# Patient Record
Sex: Female | Born: 1970 | Race: White | Hispanic: No | Smoking: Current every day smoker
Health system: Southern US, Community
[De-identification: ages and names within clinical notes are randomized; demographics above are authoritative.]

## PROBLEM LIST (undated history)

## (undated) DIAGNOSIS — M545 Low back pain, unspecified: Secondary | ICD-10-CM

## (undated) DIAGNOSIS — G540 Brachial plexus disorders: Secondary | ICD-10-CM

## (undated) DIAGNOSIS — Z8719 Personal history of other diseases of the digestive system: Secondary | ICD-10-CM

## (undated) DIAGNOSIS — G43909 Migraine, unspecified, not intractable, without status migrainosus: Secondary | ICD-10-CM

## (undated) DIAGNOSIS — F419 Anxiety disorder, unspecified: Secondary | ICD-10-CM

## (undated) DIAGNOSIS — J45909 Unspecified asthma, uncomplicated: Secondary | ICD-10-CM

## (undated) DIAGNOSIS — M940 Chondrocostal junction syndrome [Tietze]: Secondary | ICD-10-CM

## (undated) HISTORY — DX: Low back pain, unspecified: M54.50

## (undated) HISTORY — DX: Chondrocostal junction syndrome (tietze): M94.0

## (undated) HISTORY — DX: Personal history of other diseases of the digestive system: Z87.19

## (undated) HISTORY — DX: Brachial plexus disorders: G54.0

## (undated) HISTORY — PX: DILATION AND CURETTAGE OF UTERUS: SHX78

## (undated) HISTORY — PX: ENDOMETRIAL ABLATION: SHX621

## (undated) HISTORY — DX: Low back pain: M54.5

## (undated) HISTORY — DX: Anxiety disorder, unspecified: F41.9

## (undated) HISTORY — PX: OTHER SURGICAL HISTORY: SHX169

## (undated) HISTORY — DX: Migraine, unspecified, not intractable, without status migrainosus: G43.909

---

## 1998-05-25 ENCOUNTER — Other Ambulatory Visit: Admission: RE | Admit: 1998-05-25 | Discharge: 1998-05-25 | Payer: Self-pay | Admitting: *Deleted

## 1998-08-22 ENCOUNTER — Ambulatory Visit (HOSPITAL_COMMUNITY): Admission: RE | Admit: 1998-08-22 | Discharge: 1998-08-22 | Payer: Self-pay | Admitting: *Deleted

## 1999-03-11 ENCOUNTER — Other Ambulatory Visit: Admission: RE | Admit: 1999-03-11 | Discharge: 1999-03-11 | Payer: Self-pay | Admitting: Obstetrics & Gynecology

## 1999-09-17 ENCOUNTER — Inpatient Hospital Stay (HOSPITAL_COMMUNITY): Admission: AD | Admit: 1999-09-17 | Discharge: 1999-09-20 | Payer: Self-pay | Admitting: Obstetrics and Gynecology

## 1999-09-21 ENCOUNTER — Encounter: Admission: RE | Admit: 1999-09-21 | Discharge: 1999-10-15 | Payer: Self-pay | Admitting: Obstetrics and Gynecology

## 1999-09-22 ENCOUNTER — Inpatient Hospital Stay (HOSPITAL_COMMUNITY): Admission: AD | Admit: 1999-09-22 | Discharge: 1999-09-22 | Payer: Self-pay | Admitting: Obstetrics and Gynecology

## 2000-04-27 ENCOUNTER — Other Ambulatory Visit: Admission: RE | Admit: 2000-04-27 | Discharge: 2000-04-27 | Payer: Self-pay | Admitting: *Deleted

## 2001-07-31 ENCOUNTER — Other Ambulatory Visit: Admission: RE | Admit: 2001-07-31 | Discharge: 2001-07-31 | Payer: Self-pay | Admitting: *Deleted

## 2002-12-23 ENCOUNTER — Encounter: Payer: Self-pay | Admitting: Family Medicine

## 2002-12-23 ENCOUNTER — Encounter: Admission: RE | Admit: 2002-12-23 | Discharge: 2002-12-23 | Payer: Self-pay | Admitting: Family Medicine

## 2004-01-20 ENCOUNTER — Other Ambulatory Visit: Admission: RE | Admit: 2004-01-20 | Discharge: 2004-01-20 | Payer: Self-pay | Admitting: Obstetrics and Gynecology

## 2004-02-16 ENCOUNTER — Ambulatory Visit (HOSPITAL_COMMUNITY): Admission: RE | Admit: 2004-02-16 | Discharge: 2004-02-16 | Payer: Self-pay | Admitting: Obstetrics and Gynecology

## 2004-03-02 ENCOUNTER — Ambulatory Visit (HOSPITAL_COMMUNITY): Admission: RE | Admit: 2004-03-02 | Discharge: 2004-03-02 | Payer: Self-pay | Admitting: Obstetrics and Gynecology

## 2004-06-24 ENCOUNTER — Inpatient Hospital Stay (HOSPITAL_COMMUNITY): Admission: AD | Admit: 2004-06-24 | Discharge: 2004-06-24 | Payer: Self-pay | Admitting: Obstetrics and Gynecology

## 2004-07-27 ENCOUNTER — Inpatient Hospital Stay (HOSPITAL_COMMUNITY): Admission: AD | Admit: 2004-07-27 | Discharge: 2004-07-27 | Payer: Self-pay | Admitting: Obstetrics and Gynecology

## 2004-08-01 ENCOUNTER — Encounter (INDEPENDENT_AMBULATORY_CARE_PROVIDER_SITE_OTHER): Payer: Self-pay | Admitting: *Deleted

## 2004-08-01 ENCOUNTER — Inpatient Hospital Stay (HOSPITAL_COMMUNITY): Admission: AD | Admit: 2004-08-01 | Discharge: 2004-08-03 | Payer: Self-pay | Admitting: Obstetrics and Gynecology

## 2004-10-22 ENCOUNTER — Ambulatory Visit: Payer: Self-pay | Admitting: Internal Medicine

## 2004-12-16 ENCOUNTER — Ambulatory Visit: Payer: Self-pay | Admitting: Internal Medicine

## 2005-03-29 ENCOUNTER — Encounter: Admission: RE | Admit: 2005-03-29 | Discharge: 2005-03-29 | Payer: Self-pay | Admitting: Family Medicine

## 2005-03-30 ENCOUNTER — Encounter: Admission: RE | Admit: 2005-03-30 | Discharge: 2005-03-30 | Payer: Self-pay | Admitting: Family Medicine

## 2005-04-25 IMAGING — US US OB DETAIL+14 WK
1 series · 13 of 28 positions shown · non-contrast
Comparison: none

CLINICAL DATA: 18 week 2 day gestational age by LMP.  Advanced maternal age.  Evaluate dating and anatomy.

[Series 1: unknown · 0.22mm/px · 13 of 69 slices shown]
[im 3/69]
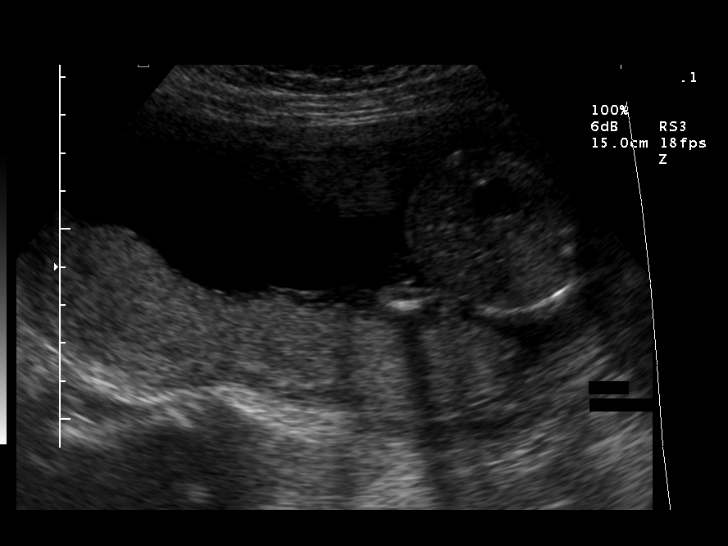
[im 8/69]
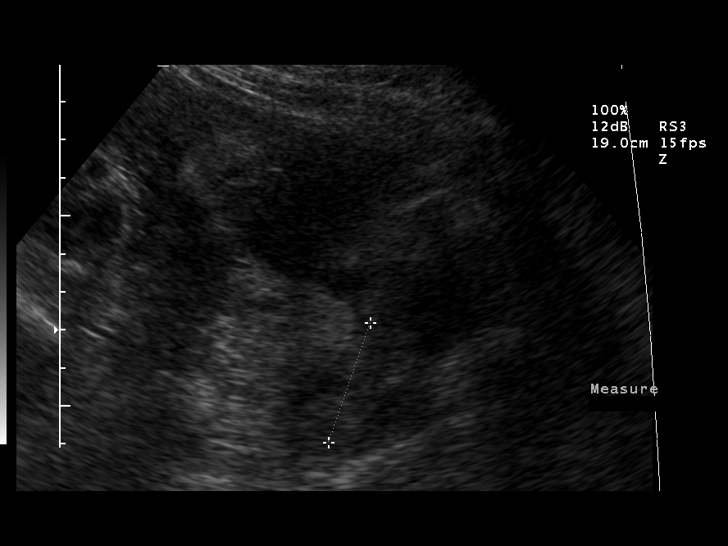
[im 13/69]
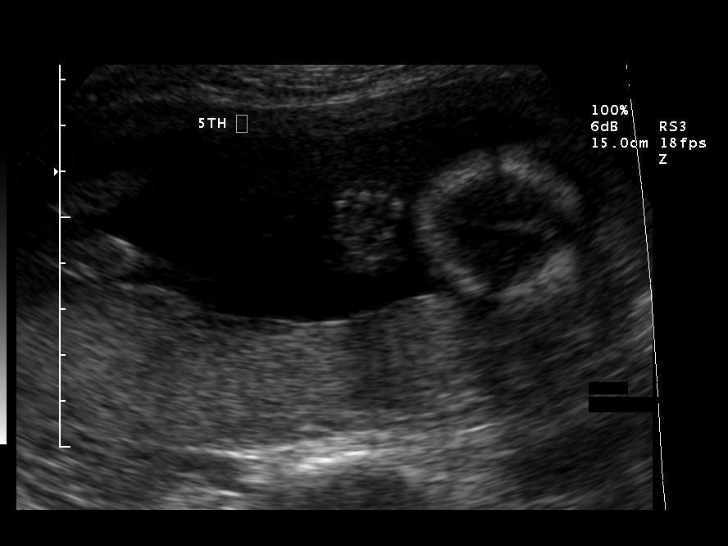
[im 18/69]
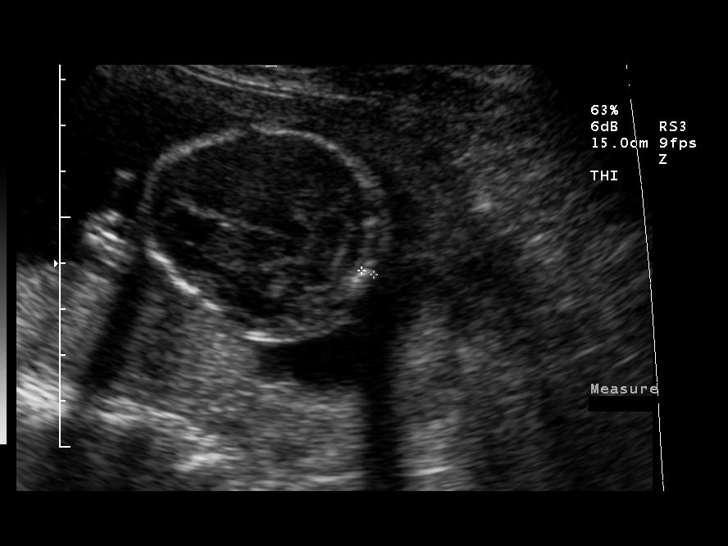
[im 23/69]
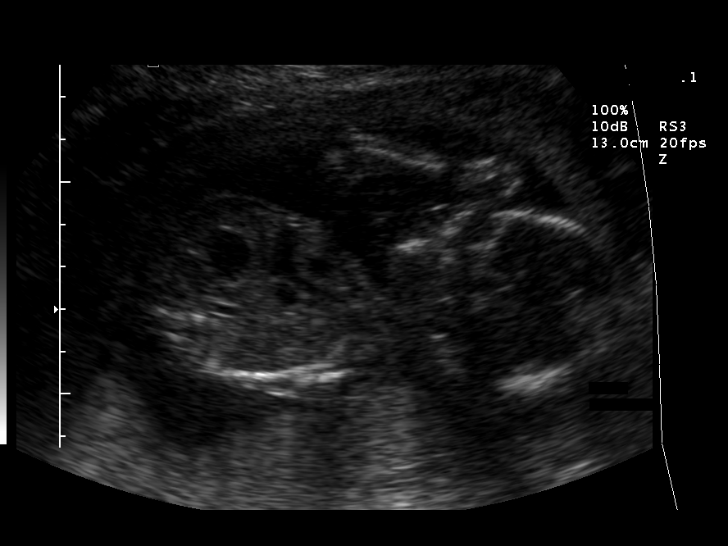
[im 28/69]
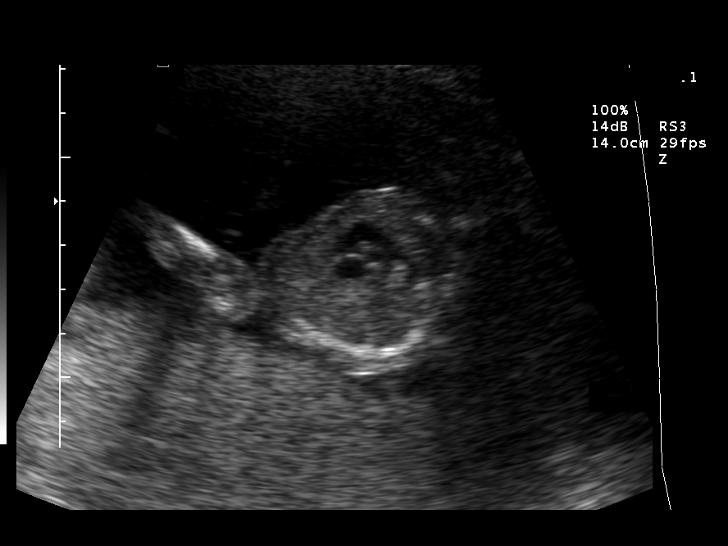
[im 36/69]
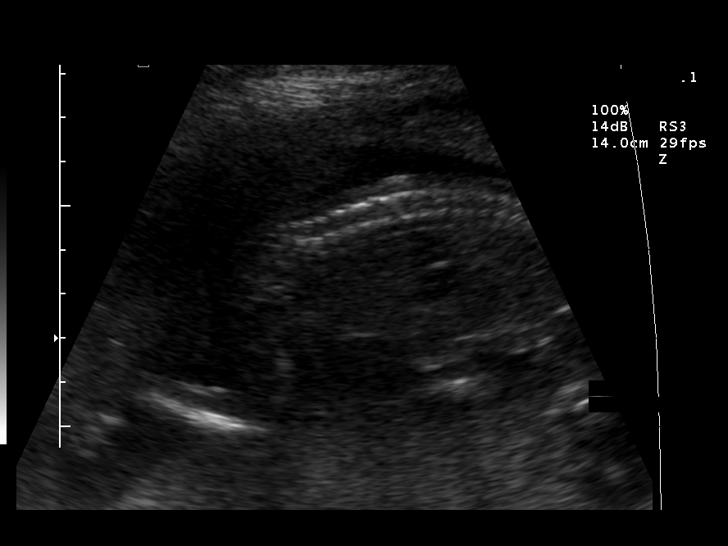
[im 41/69]
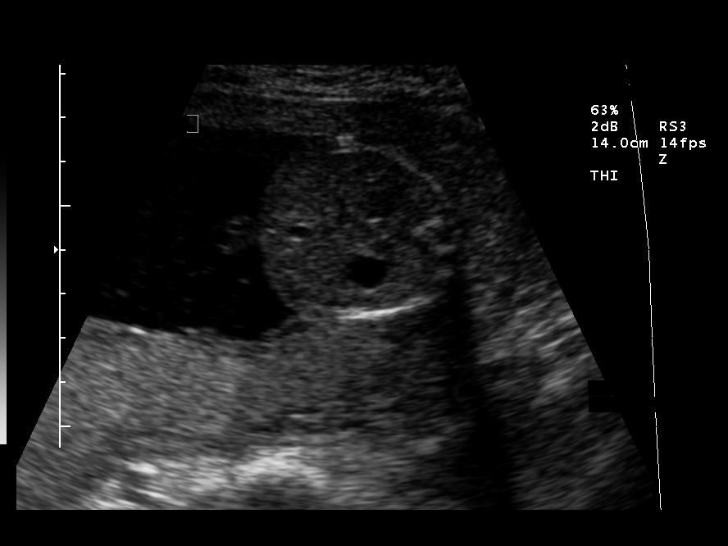
[im 46/69]
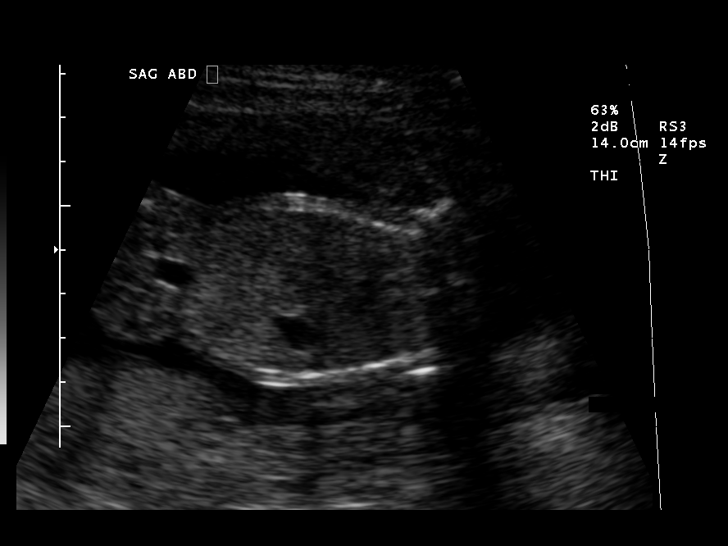
[im 51/69]
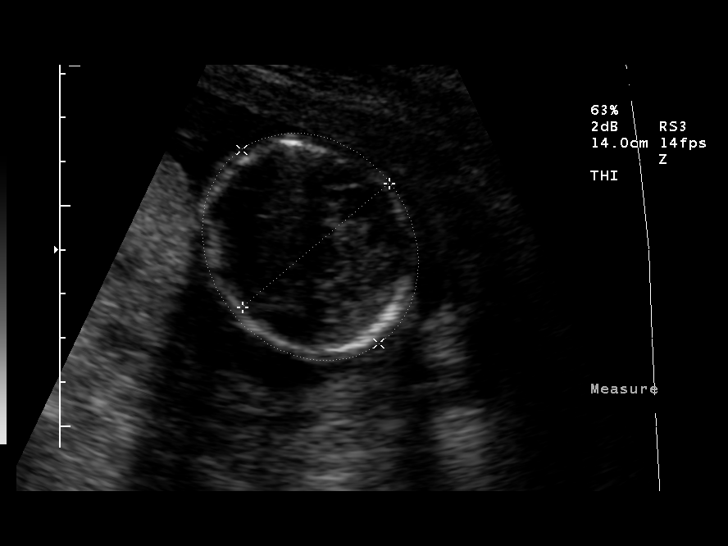
[im 56/69]
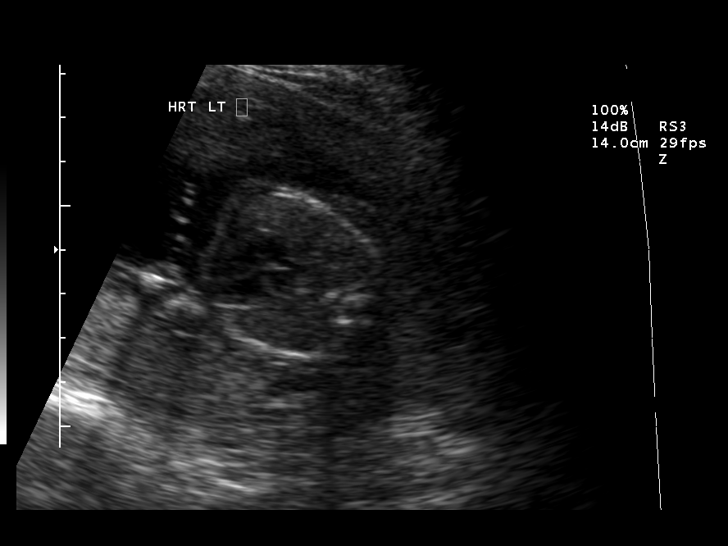
[im 61/69]
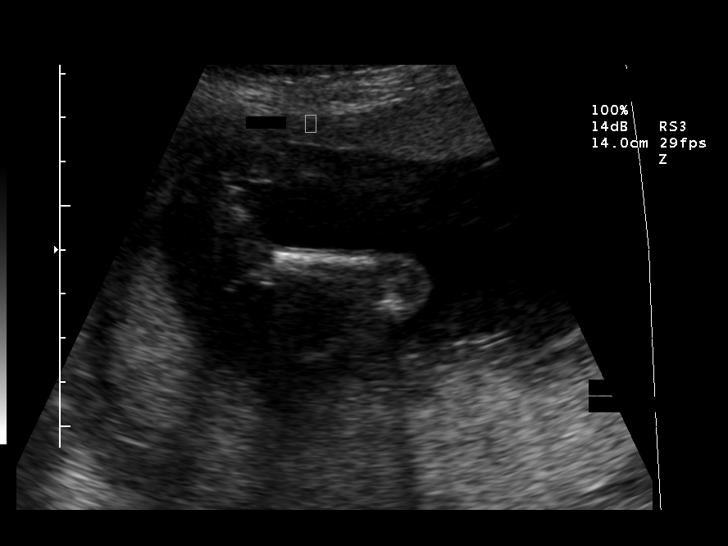
[im 66/69]
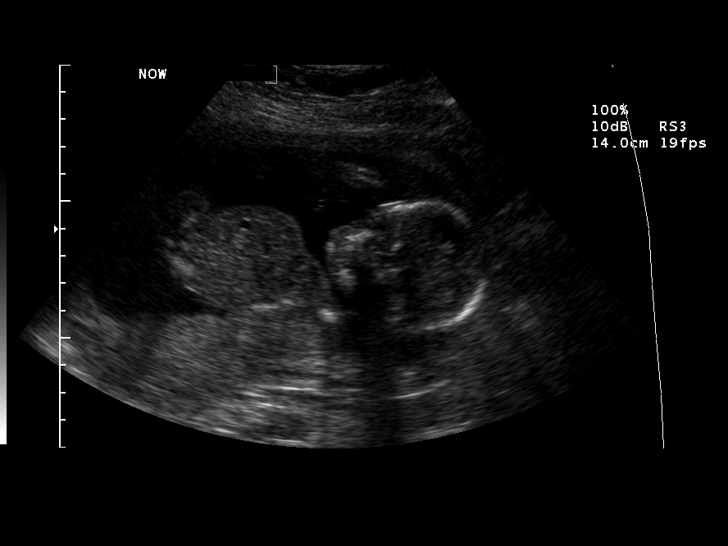

[13 of 28 positions shown; findings below may reference images not displayed]

DETAILED OBSTETRICAL ULTRASOUND 

Number of Fetuses:  1
Heart Rate:  155
Movement:  Yes
Breathing:  No
Presentation:  Variable
Placental Location:  Posterior
Grade:  I
Previa:  No
Amniotic Fluid (Subjective):  Normal
Amniotic Fluid (Objective):  4.9 cm Vertical pocket 

FETAL BIOMETRY
BPD:  4.3 cm   19 w 0 d
HC:  15.5 cm   18 w 3 d
AC:  13.8 cm   19 w 2 d
FL:  3.0 cm   19 w 2 d
HL:  2.9 cm  19 w 3 d

MEAN GA:  19 w 1 d

FETAL ANATOMY
Lateral Ventricles:  Visualized 
Thalami/CSP:  Visualized 
Posterior Fossa:  Visualized 
Nuchal Region:  Visualized 
Spine:  Visualized 
4 Chamber Heart on Left:  Visualized 
Stomach on Left:  Visualized 
3 Vessel Cord:  Visualized 
Cord Insertion Site:  Visualized 
Kidneys:  Visualized 
Bladder:  Visualized 
Extremities:  Visualized 

ADDITIONAL ANATOMY VISUALIZED:  RVOT, LVOT, upper lip, orbits, profile, diaphragm, heel, 5th digit, ductal arch, and aortic arch.
Comment:  A nasal bone is visualized.  No sonographic markers for Down syndrome are identified.  
MATERNAL FINDINGS
Cervix:  3.2 cm Transabdominally
IMPRESSION: Single living intrauterine fetus with mean gestational age of 19 weeks 1 day and sonographic EDC of 07/26/04.  This is within one week of stated LMP.  
  No evidence of fetal anatomic abnormality.  No sonographic markers for Down syndrome identified.

</u12:p>

## 2005-08-17 IMAGING — CR DG CHEST 2V
2 series · 2 of 2 positions shown · non-contrast
Comparison: 12/23/02.

CLINICAL DATA: 35 weeks gestation with bronchitis and upper respiratory infection.
 TWO VIEW CHEST ? 06/24/04:

[view not recorded (1 of 2)]
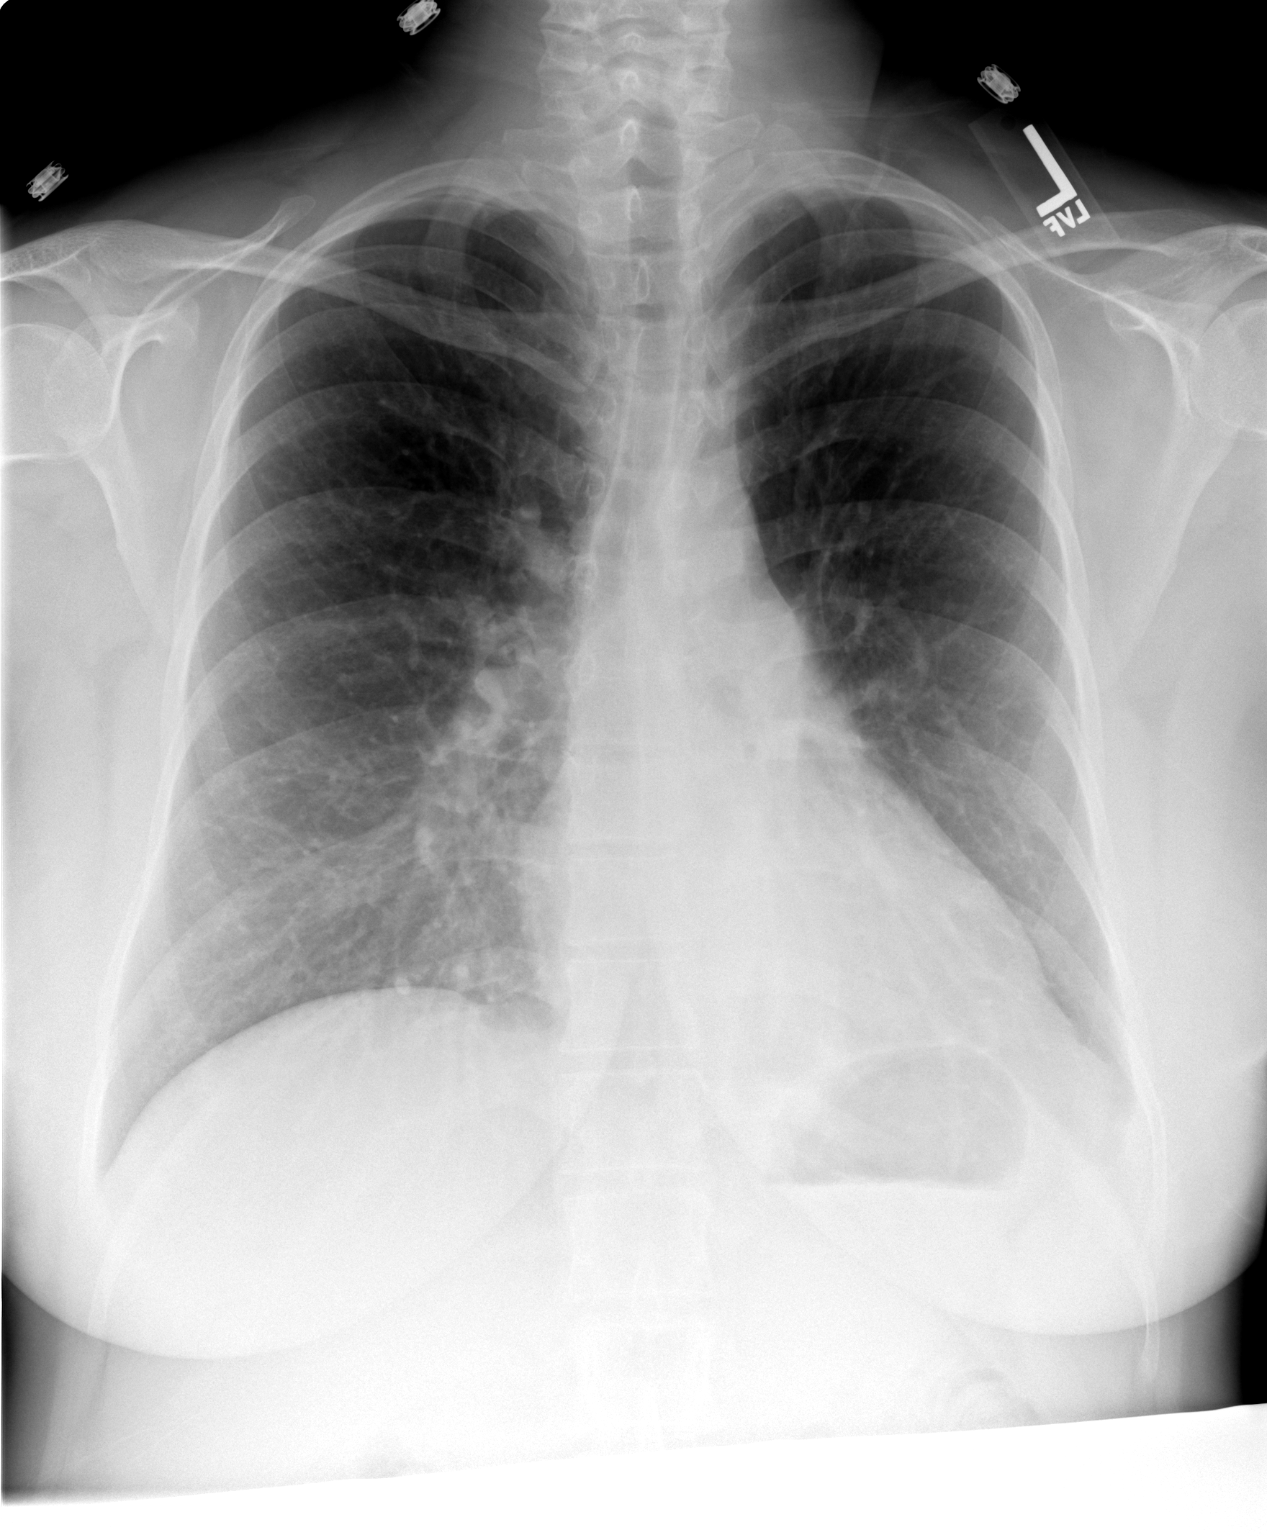

[view not recorded (2 of 2)]
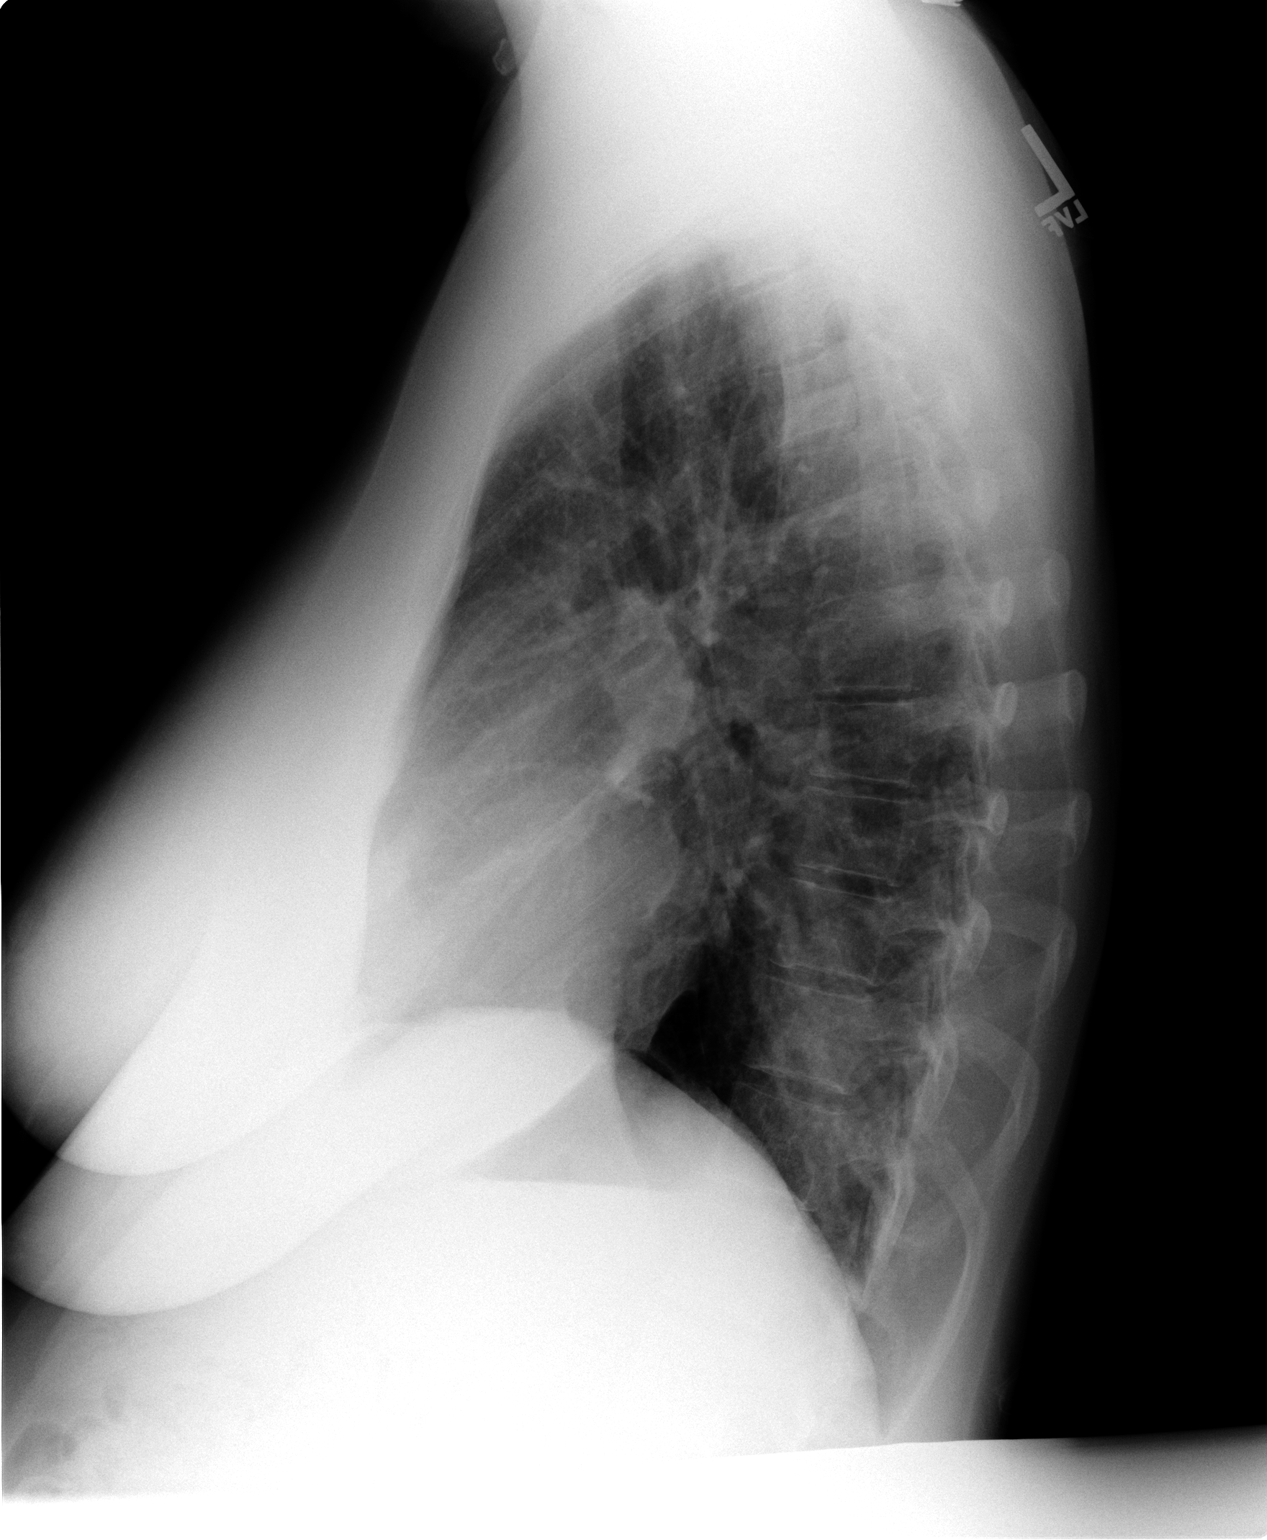

[2 of 2 positions shown; findings below may reference images not displayed]

FINDINGS: Two view examination of the chest shows streaky opacity in the posterior left lower lobe which is a stable appearance compared to the earlier study.  The right lung is clear.  Mild peribronchial thickening is apparent.  The heart size is at upper limits of normal.  The bony structures are intact.
IMPRESSION: 1.  Left lower lobe scarring, unchanged.
 2.  Mild peribronchial thickening.

## 2005-09-07 ENCOUNTER — Other Ambulatory Visit: Admission: RE | Admit: 2005-09-07 | Discharge: 2005-09-07 | Payer: Self-pay | Admitting: Obstetrics and Gynecology

## 2006-02-08 ENCOUNTER — Encounter (INDEPENDENT_AMBULATORY_CARE_PROVIDER_SITE_OTHER): Payer: Self-pay | Admitting: *Deleted

## 2006-02-08 ENCOUNTER — Ambulatory Visit (HOSPITAL_COMMUNITY): Admission: RE | Admit: 2006-02-08 | Discharge: 2006-02-08 | Payer: Self-pay | Admitting: Obstetrics and Gynecology

## 2006-04-06 ENCOUNTER — Ambulatory Visit: Payer: Self-pay | Admitting: Family Medicine

## 2006-04-12 ENCOUNTER — Encounter: Admission: RE | Admit: 2006-04-12 | Discharge: 2006-04-12 | Payer: Self-pay | Admitting: Family Medicine

## 2006-04-12 ENCOUNTER — Ambulatory Visit: Payer: Self-pay | Admitting: Family Medicine

## 2006-06-09 ENCOUNTER — Ambulatory Visit: Payer: Self-pay | Admitting: Internal Medicine

## 2006-10-13 ENCOUNTER — Ambulatory Visit: Payer: Self-pay | Admitting: Family Medicine

## 2006-11-08 LAB — CONVERTED CEMR LAB: Pap Smear: NORMAL

## 2007-03-05 ENCOUNTER — Ambulatory Visit: Payer: Self-pay | Admitting: Family Medicine

## 2007-05-10 ENCOUNTER — Telehealth: Payer: Self-pay | Admitting: Internal Medicine

## 2007-05-14 ENCOUNTER — Telehealth: Payer: Self-pay | Admitting: Internal Medicine

## 2007-06-01 ENCOUNTER — Encounter: Payer: Self-pay | Admitting: *Deleted

## 2007-06-01 DIAGNOSIS — M545 Low back pain, unspecified: Secondary | ICD-10-CM | POA: Insufficient documentation

## 2007-06-01 DIAGNOSIS — G43909 Migraine, unspecified, not intractable, without status migrainosus: Secondary | ICD-10-CM | POA: Insufficient documentation

## 2007-06-01 DIAGNOSIS — Z8719 Personal history of other diseases of the digestive system: Secondary | ICD-10-CM

## 2007-06-01 DIAGNOSIS — F411 Generalized anxiety disorder: Secondary | ICD-10-CM | POA: Insufficient documentation

## 2007-06-01 DIAGNOSIS — M940 Chondrocostal junction syndrome [Tietze]: Secondary | ICD-10-CM

## 2007-06-20 ENCOUNTER — Encounter: Payer: Self-pay | Admitting: Internal Medicine

## 2007-06-26 ENCOUNTER — Ambulatory Visit: Payer: Self-pay | Admitting: Internal Medicine

## 2007-08-14 ENCOUNTER — Telehealth: Payer: Self-pay | Admitting: Internal Medicine

## 2007-12-12 ENCOUNTER — Encounter: Admission: RE | Admit: 2007-12-12 | Discharge: 2007-12-12 | Payer: Self-pay | Admitting: Neurology

## 2008-01-17 ENCOUNTER — Telehealth: Payer: Self-pay | Admitting: Internal Medicine

## 2008-07-22 ENCOUNTER — Telehealth: Payer: Self-pay | Admitting: Internal Medicine

## 2008-09-30 ENCOUNTER — Telehealth: Payer: Self-pay | Admitting: Internal Medicine

## 2008-11-20 ENCOUNTER — Telehealth: Payer: Self-pay | Admitting: Internal Medicine

## 2009-01-06 ENCOUNTER — Ambulatory Visit: Payer: Self-pay | Admitting: Internal Medicine

## 2009-07-23 ENCOUNTER — Telehealth: Payer: Self-pay | Admitting: Internal Medicine

## 2009-12-16 ENCOUNTER — Encounter: Payer: Self-pay | Admitting: Internal Medicine

## 2009-12-17 ENCOUNTER — Telehealth: Payer: Self-pay | Admitting: Internal Medicine

## 2010-07-05 ENCOUNTER — Telehealth: Payer: Self-pay | Admitting: Internal Medicine

## 2010-07-27 NOTE — Progress Notes (Signed)
Summary: Alprazolam refill  Phone Note Refill Request Message from:  Fax from Pharmacy on July 23, 2009 11:49 AM  Refills Requested: Medication #1:  ALPRAZOLAM 0.5 MG  TABS 1 by mouth every six hours as needed   Dosage confirmed as above?Dosage Confirmed   Last Refilled: 06/23/2009 last prescription was 01-06-09  Initial call taken by: Lucious Groves,  July 23, 2009 11:49 AM  Follow-up for Phone Call        ok for refill x 5 Follow-up by: Jacques Navy MD,  July 23, 2009 1:20 PM    Prescriptions: ALPRAZOLAM 0.5 MG  TABS (ALPRAZOLAM) 1 by mouth every six hours as needed  #90 x 5   Entered by:   Ami Bullins CMA   Authorized by:   Jacques Navy MD   Signed by:   Bill Salinas CMA on 07/23/2009   Method used:   Telephoned to ...       [facility] [address] [phone]* (retail)       [address]       [place], [place]  [address]       Ph: [phone] or [phone]       Fax: [phone]   RxID:   [id number]

## 2010-07-27 NOTE — Progress Notes (Signed)
  Phone Note Refill Request Message from:  Fax from Pharmacy on December 17, 2009 8:48 AM  Refills Requested: Medication #1:  ALPRAZOLAM 0.5 MG  TABS 1 by mouth every six hours as needed   Last Refilled: 11/19/2009 recieved fax from [facility] [address]. Please Advise refill  Initial call taken by: Ami Bullins CMA,  December 17, 2009 8:48 AM  Follow-up for Phone Call        ok for refill x 5 Follow-up by: Jacques Navy MD,  December 17, 2009 5:27 PM    Prescriptions: ALPRAZOLAM 0.5 MG  TABS (ALPRAZOLAM) 1 by mouth every six hours as needed  #90 x 5   Entered by:   Ami Bullins CMA   Authorized by:   Jacques Navy MD   Signed by:   Bill Salinas CMA on 12/18/2009   Method used:   Telephoned to ...       [facility]  [address]  [phone]* (retail)       [address]       [place], [place]  [address]       Ph: [phone] or [phone]       Fax: [phone]   RxID:   [id number]

## 2010-07-27 NOTE — Medication Information (Signed)
Summary: Coordination of Care Form/[facility]  Coordination of Care Form/[facility]   Imported By: Sherian Rein 12/21/2009 11:16:25  _____________________________________________________________________  External Attachment:    Type:   Image     Comment:   External Document

## 2010-07-29 NOTE — Progress Notes (Signed)
Summary: rf xanax  Phone Note Refill Request Message from:  Fax from Pharmacy on July 05, 2010 12:00 PM  Refills Requested: Medication #1:  ALPRAZOLAM 0.5 MG  TABS 1 by mouth every six hours as needed   Last Ref[facility]: [facility] [address]  Initial call taken by: Ami Bullins CMA,  July 05, 2010 12:00 PM  Follow-up for Phone Call        ok to refill x 5 Follow-up by: Jacques Navy MD,  July 05, 2010 1:09 PM    Prescriptions: ALPRAZOLAM 0.5 MG  TABS (ALPRAZOLAM) 1 by mouth every six hours as needed  #90 x 5   Entered by:   Lamar Sprinkles, CMA   Authorized by:   Jacques Navy MD   Signed by:   Lamar Sprinkles, CMA on 07/05/2010   Method used:   Telephoned to ...       [facility]  [phone]* (retail)       [address] [place], [place]  [address]       Ph: [phone] or [phone]       Fax: [phone]   RxID:   [id number]

## 2010-11-12 NOTE — Op Note (Signed)
NAME:  Jeppsen, ENVI EAGLESON                          ACCOUNT NO.:  [id number]   MEDICAL RECORD NO.:  [id number]                   PATIENT TYPE:  OUT   LOCATION:  ULT                                  [facility]   PHYSICIAN:  Janine Limbo, M.D.            DATE OF BIRTH:  1971-02-23   DATE OF PROCEDURE:  02/16/2004  DATE OF DISCHARGE:                                 OPERATIVE REPORT   PREOPERATIVE DIAGNOSES:  1. Sixteen weeks' gestation.  2. Age greater than 35 at the time of delivery.   POSTOPERATIVE DIAGNOSES:  1. Sixteen weeks' gestation.  2. Age greater than 35 at the time of delivery.   PROCEDURE IN DETAIL:  Genetic amniocentesis.   SURGEON:  Janine Limbo, M.D.   ANESTHESIA:  Local with Xylocaine.   DISPOSITION:  Ms. Benninger is a 40 year old female, who will be 33 at the time  of her delivery.  She presents for genetic amniocentesis.  She understands  the indications for her procedure and she accepts the risks of, but not  limited to, infection, bleeding, rupture of membranes, and the possible risk  of miscarriage (one in 200).   FINDINGS:  The patient's blood type was O positive.  On ultrasound she had a  single intrauterine gestation in variable position.  The amniotic fluid  volume was normal.   PROCEDURE:  The patient was seen in the ultrasound suite.  The procedure was  reviewed with the patient and a permit was signed.  The patient's abdomen  was prepped with multiple layers of Betadine and the ultrasound was  repeated.  An appropriate fluid pocket was identified.  The patient was  sterilely draped.  The skin was anesthetized using 1 mL of 1% Xylocaine.  The spinal needle was inserted under direct visualization.  Fifteen to 20 mL  of fluid was obtained without difficulty.  The fluid was initially slightly  blood-tinged but then was very clear.  The patient tolerated her procedure  well.  Repeat ultrasound was performed, and the fetal heart motions were  noted to be normal.  There was no evidence of damage to the gestation.  Her  fluid was sent for chromosomal analysis.  The patient was discharged from  the radiology suite in stable condition.                                               Janine Limbo, M.D.    AVS/MEDQ  D:  02/16/2004  T:  02/16/2004  Job:  [id number]

## 2010-11-12 NOTE — Letter (Signed)
June 09, 2006    Rene Kocher, M.D.  [address]  [place], [place] [address]   RE:  JAICE, LAGUE  MRN:  [id number]  /  DOB:  1970-10-23   Dear Woody Seller,   Thanks very much for seeing Ms. Hartshorn for refractory migraine headache.   The patient has a longstanding history of headaches which occur 2 to 4  times a month.  She thought they were hormonal at one time but they do  not seem to be related to her menstrual cycle or hormones at this time.  She has tried multiple 5-HT products in the past including Zomig and  Relpax which do not seem to give her adequate relief.  She describes the  headache as a 30-minute aura, then a full-blown headache which will last  12-24 hours.  She has not had a history of photophobia and generally she  becomes bed bound with these headaches.  She does get relief with  Vicodin when needed.  Because of her ongoing headaches at this point, I  would appreciate your assistance in evaluating her and helping develop a  better preventative program for her as well as treatment regimen.   PAST MEDICAL HISTORY:  Surgical:  The patient had a hysteroscopy and D&C  in the summer of 2007.  She also had incision in January of a  chronically-infected follicle.  No other history of surgery.  Medical:  The patient had the usual childhood diseases.  She has a  history of low back pain.  She has a history of IBS and history of  headache as noted.  GYN history:  She is a gravida 4 para 2, 1 SAB, one TAB.   CURRENT MEDICATIONS:  1. Multivitamin daily.  2. Levsin b.i.d.  3. Omega 3 fatty acids.  4. She has been taking Relpax on an as-needed basis.  5. Xanax 0.5 q.6h p.r.n. basis.  6. She has been using Metamucil to help control her IBS.   FAMILY HISTORY:  Noncontributory, with no family history of significant  migraine.   SOCIAL HISTORY:  The patient is married.  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  Her marriage is in good  health.   My last laboratory on this patient dates from 2006 where she had a  normal CBC.  She had normal chemistries with a blood sugar of 109,  normal kidney function and liver function.  She has mild hyperlipidemia  with an LDL of 147.8, normal TSH at 1.66.  Of note, the patient did have  recent laboratory work by her gynecologist prior to her surgery as  noted.  I have no neuro imaging studies.   If I can provide any additional information in regards to this nice  patient, please do not hesitate to contact me.  I appreciate your  assistance in her care and look forward to hearing from you.    Sincerely,      Rosalyn Gess. Norins, MD  Electronically Signed    MEN/MedQ  DD: 06/09/2006  DT: 06/09/2006  Job #: [id number]

## 2010-11-12 NOTE — H&P (Signed)
NAME:  Misty Valdez, Misty Valdez NO.:  [id number]   MEDICAL RECORD NO.:  [id number]          PATIENT TYPE:  INP   LOCATION:  9133                          [facility]ACILITY:  [facility]   PHYSICIAN:  Osborn Coho, M.D.   DATE OF BIRTH:  February 15, 1971   DATE OF ADMISSION:  08/01/2004  DATE OF DISCHARGE:                                HISTORY & PHYSICAL   HISTORY OF PRESENT ILLNESS:  Misty Valdez is a 40 year old gravida 4, para 1-0-  2-1, who presents at 38 weeks' gestation following spontaneous rupture of  membranes at home for clear fluid at 9 a.m.  She has irregular contractions  with uterine irritability throughout.  Baseline of fetal heart rate monitor  is 140s with positive variability.  Strip is not yet reactive; however,  there are no decelerations noted.  In light of rupture of membranes the  patient is to be admitted in early labor.  She denies any headache, visual  changes, or epigastric pain.  She is not having any vaginal bleeding and  does report positive fetal movement.  Her pregnancy has been followed by the  M.D. service at [facility] and is remarkable for:  1.  Advanced maternal age.  She underwent amniocentesis with normal results.      The patient and husband did not want to know the sex of the baby.  2.  She has a history of preeclampsia with her previous pregnancy, a history      of HPV, and laser treatment for condyloma.  3.  History of migraines.  4.  History of postpartum depression.  5.  She is a cigarette smoker and is group B Strep negative.   Her initial prenatal visit at [facility] was January 20, 2004, at 12 weeks'  gestation, [facility] determined by dates and confirmed with ultrasound.  She  underwent amniocentesis with result of normal fetus.  Her 18-week ultrasound  was within normal limits.  Her pregnancy has been essentially unremarkable.  She has been normotensive throughout her pregnancy with no proteinuria.   OB HISTORY:  In 1988 the patient had a first trimester elective  AB with no  complications.  In 2001 the patient had a first trimester SAB with a D&C.  In 2001 the patient had a normal spontaneous vaginal delivery at term with  the birth of a 5 pound 13 ounce female infant named Misty Valdez.  The patient did  have preeclampsia with that pregnancy and experienced postpartum depression  requiring medications.   LABORATORY DATA:  Prenatal lab work:  On January 20, 2004, hemoglobin and  hematocrit 12.7 and 37.8, platelets 256,000.  Blood type and Rh O positive,  antibody screen negative.  VDRL nonreactive.  Rubella immune.  Hepatitis B  surface antigen negative.  HIV declined.  Pap smear within normal limits.  GC and Chlamydia negative.  CF testing declined.  At 28 weeks one hour  glucose challenge 127.  At 36 weeks culture of the vaginal tract is negative  for group B Strep.   PAST MEDICAL HISTORY:  1.  Significant for HPV and laser treatment for condyloma  in 1991.  2.  The patient had a history of postpartum depression requiring medication      and counseling following her previous pregnancy.  3.  She does have a history of irritable bowel syndrome.  4.  Migraines.  5.  Depression.   HABITS:  She is a cigarette smoker and denies the use of alcohol or illicit  drugs.   FAMILY HISTORY:  The patient's mother with mitral valve prolapse.  Patient's  paternal grandfather and brother with diabetes.  Patient's mother with skin  cancer and maternal grandfather with cancer of unknown type.   GENETIC HISTORY:  Unremarkable.  The patient is a 40 years old and had a  normal amniocentesis.   SOCIAL HISTORY:  [name] is a married Caucasian female.  Her husband,  Misty Valdez, is involved and supportive.  He works in Airline pilot.  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   PRIMARY CARE PHYSICIAN:  Her family physician is Misty Valdez.   ALLERGIES:  1  She has allergies to __________.  1.  SUDAFED.  2.  STEROIDS.   REVIEW OF SYSTEMS:  Are as described above.  The  patient is at term, a  gravida 4, para 1-0-2-1, with rupture of membranes at 9 a.m. this morning,  clear fluid, in early labor.   PHYSICAL EXAMINATION:  VITAL SIGNS:  Stable.  Temperature is 97.8, pulse  100, respirations 20, blood pressure 126/81.  HEENT:  Unremarkable.  HEART:  Regular rate and rhythm.  LUNGS:  Clear.  ABDOMEN:  Gravid in its contour.  Uterine fundus is noted to extend 40 cm  above the level of the pubic symphysis.  Leopold's maneuver finds the infant  to be in a longitudinalized, cephalic presentation, and the estimated fetal  weight is 6-1/2 to 7 pounds.  Baseline of the fetal heart rate monitor is  140s with average long term variability.  Fetal heart rate is not yet  reactive per fetal monitoring; however, no decelerations are noted.  The  patient is contracting irregularly with uterine irritability throughout.  PELVIC:  Sterile speculum exam finds positive pulling, positive Nitrazine,  positive fern.  Digital exam of the cervix finds it to be 3 cm dilated, 70%  effaced, with the cephalic presenting part at a -2 station.  EXTREMITIES:  Show no pathologic edema.  DTRs are 1+ with no clonus.   ASSESSMENT:  1.  Intrauterine pregnancy at term.  2.  Premature rupture of membranes in early labor.   PLAN:  1.  Admit per Dr. Osborn Coho.  2.  Routine M.D. orders.      SDM/MEDQ  D:  08/01/2004  T:  08/01/2004  Job:  [id number]

## 2010-11-12 NOTE — Discharge Summary (Signed)
[facility]  Patient:    Misty Valdez, Misty Valdez                       MRN: [id number] Adm. Date:  [id number] Disc. Date: [id number] Attending:  Shaune Spittle Dictator:   Vance Gather Duplantis, C.N.M.                           Discharge Summary  ADMISSION DIAGNOSES:          1. Intrauterine pregnancy at term.                               2. Spontaneous rupture of membranes.                               3. Preeclampsia.  DISCHARGE DIAGNOSES:          1. Intrauterine pregnancy at term.                               2. Spontaneous rupture of membranes.                               3. Preeclampsia.                               4. Prolonged second stage.                               5. Maternal fatigue.                               6. Breastfeeding.  PROCEDURE:                    1) Vacuum-assisted vaginal delivery of a viable female infant named Misty Valdez who weighed 5 pounds 13 ounces and had Apgars of 9 and 9 on September 18, 1999, by [employer]. Haygood, M.D.  HOSPITAL COURSE:              [name] is a 40 year old married white female,  essential primigravida at term who presented with spontaneous rupture of membranes in early labor.  She progressed in labor with Pitocin and epidural for anesthesia to complete at 6:20 p.m. on September 18, 1999.  By 9 p.m. that day she had been pushing for several hours and was at that point fatigued and unable to continue  with pushing effectively.  She elected to proceed with a vacuum-assisted vaginal delivery and was delivered of a viable female infant named Misty Valdez who had Apgars of 9 and 9 and weighed 5 pounds 13 ounces by Erie Noe P. Pennie Rushing, M.D. on September 18, 1999.  She postpartally was continued on magnesium sulfate for preeclampsia and was diuresing well on postpartum day #1 and her labs were all within normal limits nd she was deemed able to be discontinued from her magnesium.  Since discontinuation of  her magnesium sulfate, her vital signs had been stable.  She is afebrile. She is ambulating, voiding, and eating without difficulty.  She is also breastfeeding  without difficulty.  She is planning to use condoms for contraception.  She is deemed ready for discharge today.  DISCHARGE INSTRUCTIONS:       As per the [facility] and Gynecology handout.  DISCHARGE MEDICATIONS:        1. Motrin 600 mg p.o. q.6h. p.r.n. for pain.                               2. Prenatal vitamins one p.o. q.d.  DISCHARGE LABORATORY DATA:    Her hemoglobin is 9.8, WBC count is 3.4, and platelets are 162.  Her discharge follow-up will be in six weeks at [facility] and Gynecology or p.r.n. DD:  09/20/99 TD:  09/20/99 Job: [id number]

## 2010-11-12 NOTE — Op Note (Signed)
NAME:  Misty Valdez, Misty Valdez NO.:  [id number]   MEDICAL RECORD NO.:  [id number]          PATIENT TYPE:  AMB   LOCATION:  SDC                           [facility]   PHYSICIAN:  Janine Limbo, M.D.DATE OF BIRTH:  Feb 22, 1971   DATE OF PROCEDURE:  02/08/2006  DATE OF DISCHARGE:                                 OPERATIVE REPORT   PREOPERATIVE DIAGNOSIS:  1. Irregular menstrual cycles.  2. Endometrial polyps.  3. Skin lesion/cyst.   POSTOPERATIVE DIAGNOSIS:  1. Irregular menstrual cycles.  2. Endometrial polyps.  3. Skin lesion/cyst.  4. Submucosal fibroids.   PROCEDURE:  1. Hysteroscopy with resection.  2. Dilatation and curettage.  3. NovaSure ablation of the endometrium.  4. Removal of skin lesion/cyst.   SURGEON:  Dr. Leonard Schwartz   FIRST ASSISTANT:  None.   ANESTHETIC:  Was general.   DISPOSITION:  Ms. Misty Valdez is a 40 year old female with the above-mentioned  diagnosis.  She understands the indications for her surgical procedure and  she accepts the risks of, but not limited to, anesthetic complications,  bleeding, infection, and possible damage to surrounding organs.   FINDINGS:  The patient was found to have a uterus that was upper limits  normal size.  No adnexal masses were appreciated.  The uterus sounded to 9  cm.  The endometrial cavity was noted to have several small endometrial  polyps measuring less than 1 cm in size.  She was also found to have an  anterior and posterior submucosal fibroid measuring less than 1 cm in size.  No other pathologic lesions were noted.  There was a question of a septum at  the left anterior portion of the uterus.  The patient had a 0.5 cm  subcutaneous nodule lateral to the right labia majora near the superior  aspect.  This area was scarred and indurated.   PROCEDURE:  The patient was taken to the operating room where general  anesthetic was given.  The patient's lower abdomen, perineum, and  vagina  were prepped with multiple layers of Betadine.  The bladder was drained of  urine.  Examination under anesthesia was performed.  The patient was  sterilely draped.  A paracervical block was placed using 10 mL of half  percent Marcaine with epinephrine.  An additional 10 mL of half percent  Marcaine with epinephrine were injected directly into the cervix.  The  endocervix sounded to 3.5 cm.  An endocervical curettage was obtained.  The  uterus then sounded to 9 cm.  The cervix was gently dilated.  The diagnostic  hysteroscope was inserted with findings as mentioned above.  Pictures were  taken.  The cavity was then curetted.  Hemostasis was noted to be adequate.  We inserted the NovaSure device and proper placement was confirmed.  A test  procedure was performed and the cavity was noted to be intact.  We then  ablated the uterus for a total of 1 minute 10 seconds using a power of 108.  The patient tolerated the procedure well.  The NovaSure procedure was  removed and  the hysteroscope was again inserted.  At this point it was very  clear that there was an anterior fibroid and a posterior fibroid.  We then  used the operative hysteroscope using a single loop to resect the submucosal  portion of the fibroid.  Hemostasis was noted to be adequate throughout.  We  then removed all instruments and the nodule that was lateral to the right  labia was then injected with 8 mL of half percent Marcaine with epinephrine.  An incision was made and the nodule was resected.  The subcutaneous layer  was made hemostatic using Bovie cautery.  The subcutaneous layer was closed  using 2-0 Vicryl.  The skin was reapproximated using a subcuticular suture  of 4-0 Monocryl.  Sponge, needle, instrument counts were correct.  Estimated  blood loss for the entire procedure was approximately 10 mL.  The fluid  deficit for lactated Ringer's was 40 mL and the fluid deficit for sorbitol  was 35 mL.  There was noted  to be sorbitol on the operating room floor.  The  patient tolerated her procedure well.  She was awakened from her anesthetic  without difficulty and taken to the recovery room in stable condition.  Sponge, needle, instrument counts were correct.   FOLLOW-UP INSTRUCTIONS:  The patient will return to see Dr. Stefano Gaul in 2-3  weeks for follow-up examination.  She was given a copy of the postoperative  instruction sheet as prepared by the [facility] for  patients who have undergone a dilatation and curettage.  The patient was  given a prescription for Percocet 5/325 and she will take 1 or 2 tablets  every 4 hours as needed for pain.  She was also given a prescription for  albuterol inhaler and she will use 1-2 puffs every of 4-6 hours as needed.  The endocervical curettage, the endometrial curettings, the endometrial  resection, and the subcutaneous nodule were all sent to pathology for  evaluation.      Janine Limbo, M.D.  Electronically Signed     AVS/MEDQ  D:  02/08/2006  T:  02/08/2006  Job:  [id number]

## 2010-11-12 NOTE — H&P (Signed)
NAME:  Jares, EMOREE SASAKI NO.:  [id number]   MEDICAL RECORD NO.:  [id number]          PATIENT TYPE:  AMB   LOCATION:  SDC                           FACILITY:  [facility]   PHYSICIAN:  Janine Limbo, M.D.DATE OF BIRTH:  20-Apr-1971   DATE OF ADMISSION:  02/08/2006  DATE OF DISCHARGE:                                HISTORY & PHYSICAL   HISTORY OF PRESENT ILLNESS:  Ms. Dejonge is a 40 year old female, para 2-0-2-  2, who presents for hysteroscopy with D&C, resection of an endometrial  polyp, NovaSure procedure, and possible removal of a skin lesion.  The  patient had an ultrasound and hydrosonogram performed that showed a 1 cm  endometrial polyp.  The patient complains of irregular cycles.  She also has  an inflamed hair follicle on her vulva.  This area enlarges and then gets  small.  It has been very tender in the past.  Her most recent Pap smear was  within normal limits.   OBSTETRICAL HISTORY:  1. The patient had a vaginal delivery in 2006.  2. She had a vaginal delivery at term in 2001.  3. She had a miscarriage in 2001.  4. She had an elective pregnancy termination in 1988.   DRUG ALLERGIES:  1. STEROIDS.  2. SUDAFED.  3. AVELOX.   PAST MEDICAL HISTORY:  1. The patient has irritable bowel syndrome.  2. She also has had postpartum depression in the past.  3. The patient had her wisdom teeth removed in 1993.  4. She had a dilation and curettage in 2001.  5. She had laser ablation on condylomata in 1991.  6. The patient has a history of preeclampsia in the past.  7. She has a history of migraine headaches.   SOCIAL HISTORY:  The patient smokes 1 pack of cigarettes a day.  She denies  alcohol use and recreational drug use.   REVIEW OF SYSTEMS:  Noncontributory.   FAMILY HISTORY:  Noncontributory.   PHYSICAL EXAMINATION:  VITAL SIGNS:  Weight is 152 pounds.  HEENT:  Within normal limits.  CHEST:  Clear.  HEART:  Regular rate and rhythm.  BREASTS:  Without  masses.  ABDOMEN:  Nontender.  EXTREMITIES:  Grossly normal.  NEUROLOGIC:  Grossly normal.  PELVIC:  External genitalia - the patient has an inflamed hair follicle in  the vulva.  This area is currently smaller than it once was.  The vagina is  nontender and normal.  Cervix is nontender.  No lesions are appreciated.  Uterus is normal size, shape and consistency.  Adnexa no masses.   ASSESSMENT:  1. Irregular cycles.  2. Endometrial polyp.  3. Inflamed hair follicle.  4. Cigarette smoker.   PLAN:  The patient will undergo an NovaSure ablation preceded by  hysteroscopy, dilation and curettage, and removal of an endometrial polyp.  She understands the indications for the procedure, and she accepts the risks  of, but not limited to, anesthetic complications, bleeding, infections and  possible damage to surrounding organs.  We will try to remove the inflamed  hair follicle that has been  bothering her from the vulva.      Janine Limbo, M.D.  Electronically Signed     AVS/MEDQ  D:  02/07/2006  T:  02/07/2006  Job:  [id number]

## 2010-12-13 ENCOUNTER — Telehealth: Payer: Self-pay | Admitting: *Deleted

## 2010-12-13 NOTE — Telephone Encounter (Signed)
Refill request for Alprazolam 0.5 mg last filled 11/09/2010 QTY 90 SIG take one tablet every 6 hours prn

## 2010-12-13 NOTE — Telephone Encounter (Signed)
Ok to refill xanax x 1. If she is taking xanax tid on a regular basis will need ov to discuss better long term control

## 2010-12-14 MED ORDER — ALPRAZOLAM 0.5 MG PO TABS
0.5000 mg | ORAL_TABLET | Freq: Three times a day (TID) | ORAL | Status: DC | PRN
Start: 2010-12-14 — End: 2011-02-07

## 2011-02-07 ENCOUNTER — Telehealth: Payer: Self-pay | Admitting: *Deleted

## 2011-02-07 MED ORDER — ALPRAZOLAM 0.5 MG PO TABS: 0.5000 mg | ORAL_TABLET | Freq: Three times a day (TID) | ORAL | Status: DC | PRN

## 2011-02-07 NOTE — Telephone Encounter (Signed)
Ok for refill x 5 

## 2011-02-07 NOTE — Telephone Encounter (Signed)
Patient requesting RF of xanax - she is going out of town for 10 days and would like RF.

## 2011-02-07 NOTE — Telephone Encounter (Signed)
RX called in and pt notified//LMOVM to pick upat pharmacy

## 2011-04-15 ENCOUNTER — Emergency Department (HOSPITAL_COMMUNITY)
Admission: EM | Admit: 2011-04-15 | Discharge: 2011-04-15 | Disposition: A | Discharge status: Home / Self Care | Payer: BC Managed Care – PPO | Attending: Emergency Medicine | Admitting: Emergency Medicine

## 2011-04-15 DIAGNOSIS — R112 Nausea with vomiting, unspecified: Secondary | ICD-10-CM | POA: Insufficient documentation

## 2011-04-15 DIAGNOSIS — F1120 Opioid dependence, uncomplicated: Secondary | ICD-10-CM | POA: Insufficient documentation

## 2011-04-15 DIAGNOSIS — Z79899 Other long term (current) drug therapy: Secondary | ICD-10-CM | POA: Insufficient documentation

## 2011-04-15 LAB — POCT I-STAT, CHEM 8
BUN: 17 mg/dL (ref 6–23)
Calcium, Ion: 1.12 mmol/L (ref 1.12–1.32)
Chloride: 102 mEq/L (ref 96–112)
Creatinine, Ser: 0.7 mg/dL (ref 0.50–1.10)
Glucose, Bld: 123 mg/dL — ABNORMAL HIGH (ref 70–99)
HCT: 43 % (ref 36.0–46.0)
Hemoglobin: 14.6 g/dL (ref 12.0–15.0)
Potassium: 4.1 mEq/L (ref 3.5–5.1)
Sodium: 137 mEq/L (ref 135–145)
TCO2: 25 mmol/L (ref 0–100)

## 2011-04-15 LAB — URINALYSIS, ROUTINE W REFLEX MICROSCOPIC
Bilirubin Urine: NEGATIVE
Glucose, UA: NEGATIVE mg/dL
Hgb urine dipstick: NEGATIVE
Leukocytes, UA: NEGATIVE
Nitrite: NEGATIVE
Protein, ur: 30 mg/dL — AB
Specific Gravity, Urine: 1.031 — ABNORMAL HIGH (ref 1.005–1.030)
Urobilinogen, UA: 1 mg/dL (ref 0.0–1.0)
pH: 8 (ref 5.0–8.0)

## 2011-04-15 LAB — URINE MICROSCOPIC-ADD ON

## 2011-04-15 LAB — POCT PREGNANCY, URINE: Preg Test, Ur: NEGATIVE

## 2011-07-06 ENCOUNTER — Other Ambulatory Visit: Payer: Self-pay | Admitting: *Deleted

## 2011-07-06 MED ORDER — ALPRAZOLAM 0.5 MG PO TABS: 0.5000 mg | ORAL_TABLET | Freq: Three times a day (TID) | ORAL | Status: DC | PRN

## 2011-07-06 NOTE — Telephone Encounter (Signed)
Patient is requesting a refill of her Xanax...she was last seen by Dr. Debby Bud 12-2008. Per Dr. Debby Bud, may refill script x1 only and must be seen before any additional refills will be authorized. Script called to pharmacy.

## 2011-07-29 ENCOUNTER — Other Ambulatory Visit: Payer: Self-pay | Admitting: *Deleted

## 2011-07-29 NOTE — Telephone Encounter (Signed)
Pt called requesting rx for Xanax refill to [facility]. Pt states that she has upcoming appointment on 10/05/2011 and wants rx to last until she comes in for OV-please advise

## 2011-08-01 NOTE — Telephone Encounter (Signed)
OK for Xanax refill for Feb, March, April - original and 2 refills.

## 2011-08-02 MED ORDER — ALPRAZOLAM 0.5 MG PO TABS: 0.5000 mg | ORAL_TABLET | Freq: Three times a day (TID) | ORAL | Status: DC | PRN

## 2011-08-02 NOTE — Telephone Encounter (Signed)
Done

## 2011-08-19 ENCOUNTER — Other Ambulatory Visit: Payer: Self-pay | Admitting: *Deleted

## 2011-08-19 MED ORDER — ALPRAZOLAM 0.5 MG PO TABS: 0.5000 mg | ORAL_TABLET | Freq: Three times a day (TID) | ORAL | Status: DC | PRN

## 2011-08-19 NOTE — Progress Notes (Signed)
Patient has OV scheduled 04.2013: per pt request, [facility] and transferred to [facility].

## 2011-09-27 ENCOUNTER — Encounter: Payer: Self-pay | Admitting: Internal Medicine

## 2011-10-04 ENCOUNTER — Ambulatory Visit (INDEPENDENT_AMBULATORY_CARE_PROVIDER_SITE_OTHER): Payer: BC Managed Care – PPO | Admitting: Internal Medicine

## 2011-10-04 ENCOUNTER — Encounter: Payer: Self-pay | Admitting: Internal Medicine

## 2011-10-04 VITALS — BP 98/78 | HR 86 | Temp 98.3°F | Resp 16 | Wt 215.2 lb

## 2011-10-04 DIAGNOSIS — Z0000 Encounter for general adult medical examination without abnormal findings: Secondary | ICD-10-CM

## 2011-10-04 DIAGNOSIS — F411 Generalized anxiety disorder: Secondary | ICD-10-CM

## 2011-10-04 DIAGNOSIS — F1921 Other psychoactive substance dependence, in remission: Secondary | ICD-10-CM

## 2011-10-04 DIAGNOSIS — Z8719 Personal history of other diseases of the digestive system: Secondary | ICD-10-CM

## 2011-10-04 MED ORDER — ALPRAZOLAM 1 MG PO TABS: 1.0000 mg | ORAL_TABLET | Freq: Three times a day (TID) | ORAL | Status: DC | PRN

## 2011-10-04 MED ORDER — ALPRAZOLAM 0.5 MG PO TABS: 1.0000 mg | ORAL_TABLET | Freq: Three times a day (TID) | ORAL | Status: DC | PRN

## 2011-10-04 NOTE — Progress Notes (Signed)
Subjective:    Patient ID: Misty Valdez, female    DOB: 05-09-1971, 41 y.o.   MRN: [id number]  HPI Misty Valdez presents for follow-up evaluation. She needs Rx refills. SHe has seen her gynecologist (Dr. Stefano Gaul) recently and had full exam and finger stick labs. She went to [facility] for weight loss and had additional lab about 2 months ago but she never started phenteramine or HCG shots. She is going to [facility]: has take homes for methadone 110 mg daily and has counseling once a week.  She is feeling pretty good, but she is having some increased anxiety with dose reduction. She does take xanax 1 mg tid.   Past Medical History  Diagnosis Date  . Lumbago     low back pain, mild  . Tietze's disease     costochondritis  . Anxiety     mild  . Migraine, unspecified, without mention of intractable migraine without mention of status migrainosus   . Personal history of other diseases of digestive system     irritable bowel syndrome  . Brachial plexus disorders    Past Surgical History  Procedure Date  . Endometrial ablation   . Peridontal surgery     gum surgery - painful  . G4p2, 1 sab, 1 tab    Family History  Problem Relation Age of Onset  . Cancer Mother 40    melanoma  . Diabetes Paternal Grandfather   . Breast cancer Neg Hx   . Colon cancer Neg Hx   . Heart disease Neg Hx     CAD/ MI   History   Social History  . Marital Status: Married    Spouse Name: N/A    Number of Children: 2  . 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 Smoking status: Current Everyday Smoker -- 1.0 packs/day for 20 years    Types: Cigarettes  . Smokeless tobacco: Not on file  . Alcohol Use: No  . Drug Use: No  . Sexually Active: Yes -- Female partner(s)   Other Topics Concern  . Not on file   Social History Narrative   HSG, [employer] - CMA. MARRIED 1999.1 SON 2006-special needs,  1 DAUGHTER  '01Work; stay at home mom, school volunteer, son with some problems. Marriage OK. SO ok    Current Outpatient Prescriptions on File Prior to Visit  Medication Sig Dispense Refill  . albuterol (PROVENTIL HFA;VENTOLIN HFA) 108 (90 BASE) MCG/ACT inhaler Inhale 2 puffs into the lungs every 6 (six) hours as needed. For need to use more than 4 times/24 hr call your doctor      . dihydroergotamine (MIGRANAL) 4 MG/ML nasal spray Place 1 spray into the nose as needed. Use in one nostril as directed.  No more than 4 sprays in one hour      . Fish Oil-Cholecalciferol (FISH OIL + D3) 1200-1000 MG-UNIT CAPS Take 1 capsule by mouth 3 (three) times daily.      Marland Kitchen Hyoscyamine Sulfate (SYMAX DUOTAB) 0.375 MG TBCR Take 1 tablet by mouth 2 (two) times daily.      . Multiple Vitamin (MULTIVITAMIN) tablet Take 1 tablet by mouth daily.      Marland Kitchen almotriptan (AXERT) 6.25 MG tablet Take 6.25 mg by mouth as needed. may repeat in 2 hours if needed      . buprenorphine (SUBUTEX) 8 MG  SUBL Place 8 mg under the tongue 4 (four) times daily.          Review of Systems System review is negative for any constitutional, cardiac, pulmonary, GI or neuro symptoms or complaints other than as described in the HPI.     Objective:   Physical Exam Filed Vitals:   10/04/11 1024  BP: 98/78  Pulse: 86  Temp: 98.3 F (36.8 C)  Resp: 16   Wt Readings from Last 3 Encounters:  10/04/11 215 lb 4 oz (97.637 kg)  01/06/09 125 lb 12 oz (57.04 kg)  06/26/07 138 lb (62.596 kg)   Gen'l - overweight white woman who is anxious and emotional/tearful when talking about her drug treatment. She is committed to her recovery. HEENT- C&S clear, PERRLA Neck  Supple Cor - 2+ radial RRR Pulm - normal respirations Breast and pelvic exams deferred to Dr. Hazle Coca - obese Neuro - A&O x 3, CN II-XII grossly in tact, normal gait. Derm - no lesions on face, neck, UE       Assessment & Plan:

## 2011-10-04 NOTE — Patient Instructions (Signed)
Please obtain recent labs from [facility].  Keep up the good work with your addiction treatment. There is light at the end of the tunnel.   Refills for xanax today.  Will need to work on tobacco addiction and weight management when you have completed your methadone treatment. Come back if I can help.

## 2011-10-05 ENCOUNTER — Encounter: Payer: BC Managed Care – PPO | Admitting: Internal Medicine

## 2011-10-05 DIAGNOSIS — Z0000 Encounter for general adult medical examination without abnormal findings: Secondary | ICD-10-CM | POA: Insufficient documentation

## 2011-10-05 DIAGNOSIS — F1921 Other psychoactive substance dependence, in remission: Secondary | ICD-10-CM | POA: Insufficient documentation

## 2011-10-05 NOTE — Assessment & Plan Note (Signed)
Stable w/o complaint today.

## 2011-10-05 NOTE — Assessment & Plan Note (Signed)
Exacerbated ty the difficulties of her recovery program. She has been taking 1 mg of xanax up to three times a day.  Plan - med dose adjusted           Rx provided.

## 2011-10-05 NOTE — Assessment & Plan Note (Signed)
She is attending [address]. She has take homes and is on a fairly rapid taper. She is attending her counseling sessions. She is committed to coming off methadone.  Plan - offered encouraged           Recommended NA group participation for long-term recovery

## 2011-10-05 NOTE — Assessment & Plan Note (Signed)
Interval medical history negative except for addiction. She is current with her gynecologist. Limited exam today is normal. She has had labs at [facility] office and at Dr. [facility]ear[facility] weight loss office - she will sign release so that we may get copies Additional lab will be added as needed.  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 She will take on weight management and tobacco cessation when she has completed her current treatment.

## 2011-10-12 ENCOUNTER — Other Ambulatory Visit: Payer: Self-pay | Admitting: *Deleted

## 2011-10-12 MED ORDER — ALPRAZOLAM 1 MG PO TABS: 1.0000 mg | ORAL_TABLET | Freq: Three times a day (TID) | ORAL | Status: DC | PRN

## 2011-10-12 NOTE — Telephone Encounter (Signed)
Alprazolam Rx sent in for #[phone] by MD [should have been #[phone]] on 04.09.13; patient needs short term supply for travel out of town. Spoke directly w/[facility] pharmacist to correct error [reference # [id number] and [phone] Patient informed.

## 2011-10-17 ENCOUNTER — Telehealth: Payer: Self-pay | Admitting: *Deleted

## 2011-10-19 NOTE — Telephone Encounter (Signed)
A user error has taken place: encounter opened in error, closed for administrative reasons.

## 2012-01-26 ENCOUNTER — Other Ambulatory Visit: Payer: Self-pay

## 2012-01-26 MED ORDER — HYOSCYAMINE SULFATE ER 0.375 MG PO TBCR: 1.0000 | EXTENDED_RELEASE_TABLET | Freq: Two times a day (BID) | ORAL | Status: DC

## 2012-01-26 MED ORDER — ALPRAZOLAM 1 MG PO TABS: 1.0000 mg | ORAL_TABLET | Freq: Three times a day (TID) | ORAL | Status: DC | PRN
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
## 2012-01-27 ENCOUNTER — Other Ambulatory Visit: Payer: Self-pay | Admitting: *Deleted

## 2012-01-27 NOTE — Telephone Encounter (Signed)
Med refills called to pharmacy. alprazolam

## 2012-02-13 ENCOUNTER — Other Ambulatory Visit: Payer: Self-pay | Admitting: *Deleted

## 2012-02-13 NOTE — Telephone Encounter (Signed)
Patient  Is requesting a Rx for alprazolam 1 mg  3 x day. She states she has 2 pills left from refill on 01/26/2012 that she was only given a quantity of 50 no refill. She is out of town and will not be back in [place] till the 08/27/213. She is requesting a refill for 8 more days.of alprazolam, please advise. [facility] [phone]

## 2012-02-13 NOTE — Telephone Encounter (Signed)
Called in 1 month supply #90

## 2012-02-14 MED ORDER — ALPRAZOLAM 1 MG PO TABS: 1.0000 mg | ORAL_TABLET | Freq: Three times a day (TID) | ORAL | Status: DC | PRN

## 2012-02-14 NOTE — Telephone Encounter (Signed)
PATIENT Rx [facility]ED TO [facility] [place] AS PATIENT REQUESTED. FOR ALPRAZOLAM. LEFT MESSAGE ON PATIENT CB # THAT THIS HAD BEEN DONE

## 2012-02-17 ENCOUNTER — Telehealth: Payer: Self-pay | Admitting: Obstetrics and Gynecology

## 2012-02-17 NOTE — Telephone Encounter (Signed)
Lm on vm tcb rgd msg 

## 2012-02-17 NOTE — Telephone Encounter (Signed)
TRIAGE/EPIC °

## 2012-02-17 NOTE — Telephone Encounter (Signed)
Spoke with pt rgd msg pt states had ablation 6 yrs ago no bleeding since ablation now having some dark red/black bleeding with severe cramping pt states not soaking a pad an hr just concerned with color of blood pt out of town wants appt for Monday pt has appt 02/20/12 at 2:00 with ep pt voice understanding

## 2012-02-20 ENCOUNTER — Encounter: Payer: Self-pay | Admitting: Obstetrics and Gynecology

## 2012-02-20 ENCOUNTER — Ambulatory Visit (INDEPENDENT_AMBULATORY_CARE_PROVIDER_SITE_OTHER): Payer: BC Managed Care – PPO | Admitting: Obstetrics and Gynecology

## 2012-02-20 VITALS — BP 110/68 | Temp 98.5°F | Ht 63.25 in | Wt 180.0 lb

## 2012-02-20 DIAGNOSIS — Z1329 Encounter for screening for other suspected endocrine disorder: Secondary | ICD-10-CM

## 2012-02-20 DIAGNOSIS — L659 Nonscarring hair loss, unspecified: Secondary | ICD-10-CM

## 2012-02-20 DIAGNOSIS — Z130 Encounter for screening for diseases of the blood and blood-forming organs and certain disorders involving the immune mechanism: Secondary | ICD-10-CM

## 2012-02-20 DIAGNOSIS — N390 Urinary tract infection, site not specified: Secondary | ICD-10-CM

## 2012-02-20 DIAGNOSIS — Z1321 Encounter for screening for nutritional disorder: Secondary | ICD-10-CM

## 2012-02-20 LAB — CBC
HCT: 44 % (ref 36.0–46.0)
Hemoglobin: 15 g/dL (ref 12.0–15.0)
MCV: 94.8 fL (ref 78.0–100.0)
RBC: 4.64 MIL/uL (ref 3.87–5.11)
WBC: 9.2 10*3/uL (ref 4.0–10.5)

## 2012-02-20 MED ORDER — NITROFURANTOIN MONOHYD MACRO 100 MG PO CAPS: 100.0000 mg | ORAL_CAPSULE | Freq: Two times a day (BID) | ORAL | Status: AC

## 2012-02-20 NOTE — Progress Notes (Signed)
40 YO complaining of hair loss and change in period in that she had one day that it was black with the other day being red (total of 5 days only requiring a panty liner).  Did have awful cramps which was worse that usual  9/10, but they were relieved with Ibuporfen 800 mg. Reports significant hair loss all over for the past 2 weeks (noticed by hair stylist).  Has recently gotten rid of lice for the 5 th time but denies any scalp itching or irrtiation. Patient admits to being stressed, marital issues and a special needs child.  O:  U/A-pH-5.0,  SG- 1.020,  leukocytes- 2+, nitrite- positive,  blood-trace       UPT-negative      Abdomen: soft, non-tender      Pelvic: EGBUS-wnl, vagina-normal with beige discharg,  cervix-no tenderness, uterus-normal size, adnexae-no masses       A: UTI     Hair loss  P: TSH, Vitamin D 25-H, CBC, ferritin, urine for culture.       Macrobid 100 mg #14 bid x 7 days no refills      RTO-as scheduled  Viney Acocella, PA-C

## 2012-02-20 NOTE — Progress Notes (Signed)
When did bleeding start: 02/14/12 How  Long: x 5 days How often changing pad/tampon: use panty liner Bleeding Disorders: no Cramping: yes  With cycle Contraception: no spouse had vas. And pt had ablation Fibroids: yes Hormone Therapy: no New Medications: no Menopausal Symptoms: no Vag. Discharge: no Abdominal Pain: yes with the recent episode Increased Stress: yes pt has a special need son  PT STATES THAT HER HAIR IS COMING OUT AND THE CONCERN WITH THE CYCLE RECENTLY IS THE PAIN AND THE COLOR OF THE BLOOD. ONE DAY BLOOD WAS BRIGHT RED AND ONE DAY THE BLOOD WAS BLACK.

## 2012-02-21 ENCOUNTER — Telehealth: Payer: Self-pay | Admitting: Obstetrics and Gynecology

## 2012-02-21 LAB — FERRITIN: Ferritin: 152 ng/mL (ref 10–291)

## 2012-02-21 LAB — VITAMIN D 25 HYDROXY (VIT D DEFICIENCY, FRACTURES): Vit D, 25-Hydroxy: 36 ng/mL (ref 30–89)

## 2012-02-21 NOTE — Telephone Encounter (Signed)
40 YO called to be informed of normal TSH, Vitamin D, CBC & ferritin.  Patient advised to call for referral to a dermatologist for hair loss. Patient was agreeable.  Wants to have her blood sugar checked due to episodes of hypoglycemia and family history of diabetes.  Patient to call and set up fasting glucose at a later time. Also relayed to patient that her urine culture is still pending.  States that she was seen by a Urologist last year because of difficulty urinating ([facility]).  Madelline Eshbach, PA-C

## 2012-02-23 LAB — URINE CULTURE: Colony Count: >=100000

## 2012-03-13 ENCOUNTER — Other Ambulatory Visit: Payer: Self-pay

## 2012-03-13 MED ORDER — ALPRAZOLAM 1 MG PO TABS: 1.0000 mg | ORAL_TABLET | Freq: Three times a day (TID) | ORAL | Status: DC | PRN

## 2012-03-13 NOTE — Telephone Encounter (Signed)
Pt is also requesting a small supply to local pharmacy while waiting for mail order to precess and ship

## 2012-03-14 ENCOUNTER — Telehealth: Payer: Self-pay | Admitting: Internal Medicine

## 2012-03-14 NOTE — Telephone Encounter (Signed)
Patient will not receive her Xanax refill from her mail order before she runs out and would like a few called in to there local pharmacy[facility], if possible

## 2012-03-15 MED ORDER — ALPRAZOLAM 1 MG PO TABS: 1.0000 mg | ORAL_TABLET | Freq: Three times a day (TID) | ORAL | Status: DC | PRN

## 2012-03-15 NOTE — Telephone Encounter (Signed)
RX called in, pharmacy to notify

## 2012-03-15 NOTE — Telephone Encounter (Signed)
Is this ok to do, please advise Thanks

## 2012-03-15 NOTE — Telephone Encounter (Signed)
ok 

## 2012-03-16 ENCOUNTER — Other Ambulatory Visit: Payer: Self-pay | Admitting: *Deleted

## 2012-03-16 MED ORDER — ALPRAZOLAM 1 MG PO TABS: 1.0000 mg | ORAL_TABLET | Freq: Three times a day (TID) | ORAL | Status: DC | PRN

## 2012-03-16 NOTE — Telephone Encounter (Signed)
Refill on alprazolam called to [facility].

## 2012-03-28 ENCOUNTER — Ambulatory Visit: Payer: BC Managed Care – PPO | Admitting: Obstetrics and Gynecology

## 2012-03-29 ENCOUNTER — Ambulatory Visit (INDEPENDENT_AMBULATORY_CARE_PROVIDER_SITE_OTHER): Payer: BC Managed Care – PPO | Admitting: Obstetrics and Gynecology

## 2012-03-29 ENCOUNTER — Encounter: Payer: Self-pay | Admitting: Obstetrics and Gynecology

## 2012-03-29 VITALS — BP 110/64 | HR 72 | Resp 16 | Ht 63.0 in | Wt 181.0 lb

## 2012-03-29 DIAGNOSIS — Z9889 Other specified postprocedural states: Secondary | ICD-10-CM

## 2012-03-29 DIAGNOSIS — Z124 Encounter for screening for malignant neoplasm of cervix: Secondary | ICD-10-CM

## 2012-03-29 DIAGNOSIS — F1121 Opioid dependence, in remission: Secondary | ICD-10-CM

## 2012-03-29 NOTE — Progress Notes (Signed)
Subjective:    Misty Valdez is a 41 y.o. female, G4P2, who presents for an annual exam.   Patient reports:  Completed "rapid detox" program for opioid dependency--completed in April. Marital issues--planning separation from husband.  Patient in counseling long-term for both drug recovery and relationship/personal issues.  Has special needs child. Hx of endometrial ablation  Patient Active Problem List  Diagnosis  . ANXIETY, MILD  . MIGRAINE HEADACHE  . LOW BACK PAIN, MILD  . COSTOCHONDRITIS  . IRRITABLE BOWEL SYNDROME, HX OF  . Medication addiction in remission  . Routine health maintenance      History   Social History  . Marital Status: Married    Spouse Name: N/A    Number of Children: 2  . 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 Smoking status: Current Every Day Smoker -- 1.0 packs/day for 20 years    Types: Cigarettes  . Smokeless tobacco: Never Used  . Alcohol Use: No  . Drug Use: No  . Sexually Active: Not Currently -- Female partner(s)    Birth Control/ Protection: Surgical     vasc and pt had ablation/Vasectomy   Other Topics Concern  . None   Social History Narrative   HSG, [employer] - CMA. MARRIED 1999.1 SON 2006-special needs,  1 DAUGHTER '01Work; stay at home mom, school volunteer, son with some problems. Marriage OK. SO ok    Menstrual cycle:   LMP: No LMP recorded. Patient has had an ablation.           Cycle: Occasional spotting  The following portions of the patient's history were reviewed and updated as appropriate: allergies, current medications, past family history, past medical history, past social history, past surgical history and problem list.  Review of Systems Pertinent items are noted in HPI. Breast:Negative for breast lump,nipple discharge or nipple retraction Gastrointestinal: Negative for abdominal pain, change in bowel habits or rectal bleeding Urinary:negative   Objective:    BP 110/64  Pulse 72  Resp 16  Ht 5\' 3"  (1.6 m)  Wt 181 lb (82.101 kg)  BMI 32.06 kg/m2    Weight:  Wt Readings from Last 1 Encounters:  03/29/12 181 lb (82.101 kg)          BMI: Body mass index is 32.06 kg/(m^2).  General Appearance: Alert, appropriate appearance for age. No acute distress HEENT: Grossly normal Neck / Thyroid: Supple, no masses, nodes or enlargement Lungs: clear to auscultation bilaterally Back: No CVA tenderness Breast Exam: No masses or nodes.No dimpling, nipple retraction or discharge. Cardiovascular: Regular rate and rhythm. S1, S2, no murmur Gastrointestinal: Soft, non-tender, no masses or organomegaly Pelvic Exam: Vulva and vagina appear normal. Bimanual exam reveals normal uterus and adnexa. Rectovaginal: normal rectal, no masses Lymphatic Exam: Non-palpable nodes in neck, clavicular, axillary, or inguinal regions Skin: no rash or abnormalities Neurologic: Normal gait and speech, no tremor  Psychiatric: Alert and oriented, appropriate affect.   Wet Prep:not applicable Urinalysis:not applicable UPT: Not done   Assessment:    Normal gyn exam  S/P ablation In recovery from opioid addiction  Marital issues  Plan:    Mammogram: Needs to schedule--patient reports her best friend is a Marine scientist, so she will coordinate with her Pap:  Done today STD screening: declined Contraception:  Ablation and vasectomy Support to patient for issues and coping skills--commended her for recovery status.  Continue  work with therapist. Will continue to observe spotting--if increases, will f/u with [facility].      Nyra Capes, [place]

## 2012-03-29 NOTE — Progress Notes (Signed)
Regular Periods: no Mammogram: no  Monthly Breast Ex.: no Exercise: yes  Tetanus < 10 years: no Seatbelts: yes  NI. Bladder Functn.: yes Abuse at home: no  Daily BM's: yes Stressful Work: yes  Healthy Diet: yes Sigmoid-Colonoscopy: None  Calcium: no Medical problems this year: Opiate addiction/treated 09/2011   LAST PAP:09/24/2009 WNL  Contraception: None--Vasectomy   Mammogram:  None  PCP: Norins  PMH: Opiate addiction  FMH: None  Last Bone Scan: None

## 2012-04-02 LAB — PAP IG W/ RFLX HPV ASCU

## 2012-04-03 LAB — HUMAN PAPILLOMAVIRUS, HIGH RISK: HPV DNA High Risk: NOT DETECTED

## 2012-04-04 ENCOUNTER — Encounter: Payer: Self-pay | Admitting: Obstetrics and Gynecology

## 2012-04-04 DIAGNOSIS — R896 Abnormal cytological findings in specimens from other organs, systems and tissues: Secondary | ICD-10-CM

## 2012-07-02 ENCOUNTER — Other Ambulatory Visit: Payer: Self-pay | Admitting: *Deleted

## 2012-07-02 MED ORDER — ALPRAZOLAM 1 MG PO TABS: 1.0000 mg | ORAL_TABLET | Freq: Three times a day (TID) | ORAL | Status: DC | PRN

## 2012-11-29 ENCOUNTER — Other Ambulatory Visit: Payer: Self-pay

## 2012-11-30 ENCOUNTER — Other Ambulatory Visit: Payer: Self-pay | Admitting: Obstetrics and Gynecology

## 2012-11-30 MED ORDER — ALPRAZOLAM 1 MG PO TABS: 1.0000 mg | ORAL_TABLET | Freq: Three times a day (TID) | ORAL | Status: DC | PRN

## 2012-11-30 NOTE — Telephone Encounter (Signed)
Script faxed to [facility]

## 2012-12-10 ENCOUNTER — Encounter (HOSPITAL_COMMUNITY): Payer: Self-pay | Admitting: *Deleted

## 2012-12-10 ENCOUNTER — Encounter (HOSPITAL_COMMUNITY): Payer: Self-pay | Admitting: Pharmacist

## 2012-12-17 ENCOUNTER — Telehealth: Payer: Self-pay | Admitting: Obstetrics and Gynecology

## 2012-12-17 NOTE — Telephone Encounter (Signed)
The patient was called. There was no answer. A message was left on a call just to see how she was doing in preparation for surgery. Dr. Stefano Gaul

## 2012-12-17 NOTE — H&P (Signed)
Admission History and Physical Exam for a Gynecology Patient  Misty Valdez is a 42 y.o. female, G4P2, who presents for hysteroscopy with resection of any endometrial tissue that remains.. She has been followed at the [facility] and [facility]cology division of [facility]. The patient complains of irregular uterine bleeding. She is status post endometrial ablation.  She is recovering from pain medication dependency. She is doing well.  OB History   Grav Para Term Preterm Abortions TAB SAB Ect Mult Living   4 2        2       Past Medical History  Diagnosis Date  . Lumbago     low back pain, mild  . Tietze's disease     costochondritis  . Anxiety     mild  . Migraine, unspecified, without mention of intractable migraine without mention of status migrainosus   . Personal history of other diseases of digestive system     irritable bowel syndrome  . Brachial plexus disorders   . Asthma     due to smoking- no wheezing currently    No prescriptions prior to admission    Past Surgical History  Procedure Laterality Date  . Endometrial ablation    . Peridontal surgery      gum surgery - painful  . G4p2, 1 sab, 1 tab    . Dilation and curettage of uterus      Allergies  Allergen Reactions  . Corticosteroids Other (See Comments)    Feels crazy  . Moxifloxacin Swelling  . Amoxicillin Nausea And Vomiting and Palpitations  . Amoxicillin-Pot Clavulanate Nausea And Vomiting and Palpitations    Family History: family history includes Cancer (age of onset: 21) in her mother and Diabetes in her paternal grandfather.  There is no history of Breast cancer, and Colon cancer, and Heart disease, .  Social History:  reports that she has been smoking Cigarettes.  She has a 20 pack-year smoking history. She has never used smokeless tobacco. She reports that she does not drink alcohol or use illicit drugs.  Review of systems: See HPI.  Admission Physical  Exam:    Body mass index is 26.58 kg/(m^2).  Height 5\' 3"  (1.6 m), weight 150 lb (68.04 kg).  HEENT:                 Within normal limits Chest:                   Clear Heart:                    Regular rate and rhythm Breasts:                No masses, skin changes, bleeding, or discharge present Abdomen:             Nontender, no masses Extremities:          Grossly normal Neurologic exam: Grossly normal  Pelvic exam:  External genitalia: normal general appearance Vaginal: normal without tenderness, induration or masses Cervix: normal appearance Adnexa: normal bimanual exam Uterus: Upper limits normal size  Assessment:  Irregular uterine bleeding  Status post endometrial ablation  Recovering from pain medication dependency  Possible endometrial agglutination on ultrasound  Plan:  The patient will undergo hysteroscopy with rollerball ablation of any remaining endometrial tissue. She understands the indications for her surgical procedure. She accepts the risk of, but not limited to, anesthetic complications, bleeding, infections, and  possible damage to the surrounding organs.   Janine Limbo 12/17/2012

## 2012-12-18 ENCOUNTER — Encounter (HOSPITAL_COMMUNITY): Payer: Self-pay | Admitting: Anesthesiology

## 2012-12-18 ENCOUNTER — Encounter (HOSPITAL_COMMUNITY)
Admission: RE | Disposition: A | Discharge status: Home / Self Care | Payer: Self-pay | Source: Ambulatory Visit | Attending: Obstetrics and Gynecology

## 2012-12-18 ENCOUNTER — Ambulatory Visit (HOSPITAL_COMMUNITY)
Admission: RE | Admit: 2012-12-18 | Discharge: 2012-12-18 | Disposition: A | Discharge status: Home / Self Care | Payer: BC Managed Care – PPO | Source: Ambulatory Visit | Attending: Obstetrics and Gynecology | Admitting: Obstetrics and Gynecology

## 2012-12-18 ENCOUNTER — Ambulatory Visit (HOSPITAL_COMMUNITY): Payer: BC Managed Care – PPO | Admitting: Anesthesiology

## 2012-12-18 DIAGNOSIS — F411 Generalized anxiety disorder: Secondary | ICD-10-CM | POA: Insufficient documentation

## 2012-12-18 DIAGNOSIS — F1111 Opioid abuse, in remission: Secondary | ICD-10-CM | POA: Insufficient documentation

## 2012-12-18 DIAGNOSIS — F17200 Nicotine dependence, unspecified, uncomplicated: Secondary | ICD-10-CM | POA: Insufficient documentation

## 2012-12-18 DIAGNOSIS — N926 Irregular menstruation, unspecified: Secondary | ICD-10-CM | POA: Insufficient documentation

## 2012-12-18 DIAGNOSIS — J45909 Unspecified asthma, uncomplicated: Secondary | ICD-10-CM | POA: Insufficient documentation

## 2012-12-18 DIAGNOSIS — G43909 Migraine, unspecified, not intractable, without status migrainosus: Secondary | ICD-10-CM | POA: Insufficient documentation

## 2012-12-18 DIAGNOSIS — K589 Irritable bowel syndrome without diarrhea: Secondary | ICD-10-CM | POA: Insufficient documentation

## 2012-12-18 DIAGNOSIS — G709 Myoneural disorder, unspecified: Secondary | ICD-10-CM | POA: Insufficient documentation

## 2012-12-18 HISTORY — DX: Unspecified asthma, uncomplicated: J45.909

## 2012-12-18 HISTORY — PX: DILATATION & CURRETTAGE/HYSTEROSCOPY WITH RESECTOCOPE: SHX5572

## 2012-12-18 LAB — CBC
HCT: 41.6 % (ref 36.0–46.0)
Hemoglobin: 14.3 g/dL (ref 12.0–15.0)
MCH: 33.6 pg (ref 26.0–34.0)
RBC: 4.26 MIL/uL (ref 3.87–5.11)

## 2012-12-18 LAB — PREGNANCY, URINE: Preg Test, Ur: NEGATIVE

## 2012-12-18 SURGERY — DILATATION & CURETTAGE/HYSTEROSCOPY WITH RESECTOCOPE
Anesthesia: General | Site: Vagina | Wound class: Clean Contaminated

## 2012-12-18 MED ORDER — ACETAMINOPHEN 10 MG/ML IV SOLN
INTRAVENOUS | Status: AC
  Filled 2012-12-18: qty 100

## 2012-12-18 MED ORDER — ONDANSETRON HCL 4 MG/2ML IJ SOLN
INTRAMUSCULAR | Status: DC | PRN
  Administered 2012-12-18: 4 mg via INTRAVENOUS

## 2012-12-18 MED ORDER — BUPIVACAINE-EPINEPHRINE (PF) 0.5% -1:200000 IJ SOLN
INTRAMUSCULAR | Status: AC
  Filled 2012-12-18: qty 10

## 2012-12-18 MED ORDER — MEPERIDINE HCL 25 MG/ML IJ SOLN: 6.2500 mg | INTRAMUSCULAR | Status: DC | PRN

## 2012-12-18 MED ORDER — ACETAMINOPHEN 10 MG/ML IV SOLN
INTRAVENOUS | Status: DC | PRN
  Administered 2012-12-18: 1000 mg via INTRAVENOUS

## 2012-12-18 MED ORDER — KETOROLAC TROMETHAMINE 30 MG/ML IJ SOLN
INTRAMUSCULAR | Status: AC
  Filled 2012-12-18: qty 1

## 2012-12-18 MED ORDER — PROPOFOL 10 MG/ML IV EMUL
INTRAVENOUS | Status: AC
  Filled 2012-12-18: qty 20

## 2012-12-18 MED ORDER — TRAMADOL HCL 50 MG PO TABS: 50.0000 mg | ORAL_TABLET | Freq: Four times a day (QID) | ORAL | Status: AC | PRN

## 2012-12-18 MED ORDER — PROMETHAZINE HCL 12.5 MG PO TABS: 12.5000 mg | ORAL_TABLET | Freq: Four times a day (QID) | ORAL | Status: AC | PRN

## 2012-12-18 MED ORDER — IBUPROFEN 800 MG PO TABS: 800.0000 mg | ORAL_TABLET | Freq: Three times a day (TID) | ORAL | Status: AC | PRN

## 2012-12-18 MED ORDER — MIDAZOLAM HCL 5 MG/5ML IJ SOLN
INTRAMUSCULAR | Status: DC | PRN
  Administered 2012-12-18: 2 mg via INTRAVENOUS

## 2012-12-18 MED ORDER — PROPOFOL 10 MG/ML IV BOLUS
INTRAVENOUS | Status: DC | PRN
  Administered 2012-12-18: 50 mg via INTRAVENOUS
  Administered 2012-12-18: 200 mg via INTRAVENOUS

## 2012-12-18 MED ORDER — BUPIVACAINE-EPINEPHRINE 0.5% -1:200000 IJ SOLN
INTRAMUSCULAR | Status: DC | PRN
  Administered 2012-12-18: 10 mL

## 2012-12-18 MED ORDER — METOCLOPRAMIDE HCL 5 MG/ML IJ SOLN: 10.0000 mg | Freq: Once | INTRAMUSCULAR | Status: DC | PRN

## 2012-12-18 MED ORDER — LIDOCAINE HCL (CARDIAC) 20 MG/ML IV SOLN
INTRAVENOUS | Status: DC | PRN
  Administered 2012-12-18: 70 mg via INTRAVENOUS
  Administered 2012-12-18: 30 mg via INTRAVENOUS

## 2012-12-18 MED ORDER — ONDANSETRON HCL 4 MG/2ML IJ SOLN
INTRAMUSCULAR | Status: AC
  Filled 2012-12-18: qty 2

## 2012-12-18 MED ORDER — MIDAZOLAM HCL 2 MG/2ML IJ SOLN
INTRAMUSCULAR | Status: AC
  Filled 2012-12-18: qty 2

## 2012-12-18 MED ORDER — LIDOCAINE HCL (CARDIAC) 20 MG/ML IV SOLN
INTRAVENOUS | Status: AC
  Filled 2012-12-18: qty 5

## 2012-12-18 MED ORDER — KETOROLAC TROMETHAMINE 30 MG/ML IJ SOLN
INTRAMUSCULAR | Status: DC | PRN
  Administered 2012-12-18: 60 mg via INTRAVENOUS

## 2012-12-18 MED ORDER — FENTANYL CITRATE 0.05 MG/ML IJ SOLN
INTRAMUSCULAR | Status: AC
  Filled 2012-12-18: qty 2

## 2012-12-18 MED ORDER — TRAMADOL HCL 50 MG PO TABS
50.0000 mg | ORAL_TABLET | Freq: Once | ORAL | Status: AC
  Administered 2012-12-18: 50 mg via ORAL
  Filled 2012-12-18: qty 1

## 2012-12-18 MED ORDER — GLYCINE 1.5 % IR SOLN
Status: DC | PRN
  Administered 2012-12-18: 3000 mL

## 2012-12-18 MED ORDER — LACTATED RINGERS IV SOLN
INTRAVENOUS | Status: DC
  Administered 2012-12-18 (×2): via INTRAVENOUS

## 2012-12-18 SURGICAL SUPPLY — 18 items
CANISTER SUCTION 2500CC (MISCELLANEOUS) ×2 IMPLANT
CATH ROBINSON RED A/P 16FR (CATHETERS) ×2 IMPLANT
CLOTH BEACON ORANGE TIMEOUT ST (SAFETY) ×2 IMPLANT
CONTAINER PREFILL 10% NBF 60ML (FORM) ×4 IMPLANT
DILATOR CANAL MILEX (MISCELLANEOUS) ×1 IMPLANT
DRESSING TELFA 8X3 (GAUZE/BANDAGES/DRESSINGS) ×2 IMPLANT
ELECT REM PT RETURN 9FT ADLT (ELECTROSURGICAL)
ELECTRODE REM PT RTRN 9FT ADLT (ELECTROSURGICAL) IMPLANT
ELECTRODE ROLLER BARREL 22FR (ELECTROSURGICAL) ×1 IMPLANT
GLOVE BIOGEL PI IND STRL 8.5 (GLOVE) ×1 IMPLANT
GLOVE BIOGEL PI INDICATOR 8.5 (GLOVE) ×1
GLOVE ECLIPSE 8.0 STRL XLNG CF (GLOVE) ×4 IMPLANT
GOWN STRL REIN XL XLG (GOWN DISPOSABLE) ×4 IMPLANT
LOOP ANGLED CUTTING 22FR (CUTTING LOOP) IMPLANT
PACK HYSTEROSCOPY LF (CUSTOM PROCEDURE TRAY) ×2 IMPLANT
PAD OB MATERNITY 4.3X12.25 (PERSONAL CARE ITEMS) ×2 IMPLANT
TOWEL OR 17X24 6PK STRL BLUE (TOWEL DISPOSABLE) ×4 IMPLANT
WATER STERILE IRR 1000ML POUR (IV SOLUTION) ×2 IMPLANT

## 2012-12-18 NOTE — Anesthesia Preprocedure Evaluation (Signed)
Anesthesia Evaluation  Patient identified by MRN, date of birth, ID band Patient awake    Reviewed: Allergy & Precautions, H&P , NPO status , Patient's Chart, lab work & pertinent test results  Airway Mallampati: II TM Distance: >3 FB Neck ROM: Full    Dental no notable dental hx. (+) Teeth Intact, Missing and Caps   Pulmonary asthma ,  breath sounds clear to auscultation  Pulmonary exam normal       Cardiovascular negative cardio ROS  Rhythm:Regular Rate:Normal     Neuro/Psych  Headaches, Anxiety Brachial plexus neuropathy left   Neuromuscular disease    GI/Hepatic (+)     substance abuse   , Hx/o opiate abuse Pt. Request no opiates during procedure. IBS   Endo/Other  negative endocrine ROS  Renal/GU negative Renal ROS     Musculoskeletal   Abdominal   Peds  Hematology negative hematology ROS (+)   Anesthesia Other Findings   Reproductive/Obstetrics ASCUS on Pap smear                           Anesthesia Physical Anesthesia Plan  ASA: II  Anesthesia Plan: General   Post-op Pain Management:    Induction: Intravenous  Airway Management Planned: LMA  Additional Equipment:   Intra-op Plan:   Post-operative Plan: Extubation in OR  Informed Consent:   Dental advisory given  Plan Discussed with: CRNA, Anesthesiologist and Surgeon  Anesthesia Plan Comments:         Anesthesia Quick Evaluation

## 2012-12-18 NOTE — Anesthesia Postprocedure Evaluation (Signed)
  Anesthesia Post-op Note  Patient: Misty Valdez  Procedure(s) Performed: Procedure(s) with comments: DILATATION & CURETTAGE/HYSTEROSCOPY WITH RESECTOCOPE WITH ROLLERBALL (N/A) - Hysteroscopy with resection of the endometrium using Rollerball  Patient Location: PACU  Anesthesia Type:General  Level of Consciousness: awake, alert  and oriented  Airway and Oxygen Therapy: Patient Spontanous Breathing  Post-op Pain: none  Post-op Assessment: Post-op Vital signs reviewed, Patient's Cardiovascular Status Stable, Respiratory Function Stable, Patent Airway, No signs of Nausea or vomiting and Pain level controlled  Post-op Vital Signs: Reviewed and stable  Complications: No apparent anesthesia complications

## 2012-12-18 NOTE — Op Note (Addendum)
OPERATIVE NOTE  Misty Valdez  DOB:    08-Dec-1970  MRN:    [id number]  CSN:    [id number]  Date of Surgery:  12/18/2012  Preoperative Diagnosis:  Irregular uterine bleeding Agglutination of the endometrial cavity  Postoperative Diagnosis:  Irregular uterine bleeding Agglutination of the endometrial cavity  Procedure:  Hysteroscopy with ablation of endometrial tissue using the rollerball Dilatation and curettage  Surgeon:  Leonard Schwartz, M.D.  Assistant:  None  Anesthetic:  General  Disposition:  The patient is a 42 y.o.-year-old female who presents with irregular uterine bleeding. The patient has had an endometrial ablation in the past. She understands the indications for her surgical procedure. She accepts the risk of, but not limited to, anesthetic complications, bleeding, infections, and possible damage to the surrounding organs.  Findings:  On examination under anesthesia the uterus was upper limits normal size. No adnexal masses were appreciated. No parametrial disease was appreciated. The uterus sounded to 7 cm. The patient was noted to have  a thin endometrial lining. The cavity was partially agglutinated.  Procedure:  The patient was taken to the operating room where a general anesthetic was given. The perineum and vagina were prepped with Betadine. The bladder was drained of urine. The patient was sterilely draped. Examination under anesthesia was performed. A paracervical block was placed using 10 cc of half percent Marcaine with epinephrine. An endocervical curettage was performed. The cervix was gently dilated. The diagnostic hysteroscope was inserted and the cavity was carefully inspected. Pictures were taken. Findings included: A partially agglutinated endometrial cavity and thin endometrial glands. The diagnostic hysteroscope was removed. The cervix was dilated further. The cavity was curetted using a medium sharp curet until it was felt to be  clean. The operative hysteroscope was inserted. The rollerball was used to ablate any endometrium that we saw. The cavity was thought to be clean and well ablated. Hemostasis was adequate. All instruments were removed. The examination was repeated and the uterus was noted to be firm. Sponge, and needle counts were correct. The estimated blood loss for the procedure was 10 cc. The estimated fluid deficit loss 185 cc. The patient was awakened from her anesthetic without difficulty. She was returned to the supine position and and transported to the recovery room in stable condition. The endocervical curettings and endometrial curettings were sent to pathology.  Followup instructions:  The patient will return to see Dr. Stefano Gaul in 2 weeks. She was given a copy of the postoperative instructions for patients who've undergone hysteroscopy.  Discharge medications:  Motrin 800 mg every 8 hours as needed for mild to moderate pain. Ultram 50 mg one tablet every 4 hours as needed for severe pain. Phenergan 12.5 mg every 6 hours as needed for nausea.  Leonard Schwartz, M.D.

## 2012-12-18 NOTE — H&P (Signed)
The patient was interviewed and examined today.  The previously documented history and physical examination was reviewed. There are no changes. The operative procedure was reviewed. The risks and benefits were outlined again. The specific risks include, but are not limited to, anesthetic complications, bleeding, infections, and possible damage to the surrounding organs. The patient's questions were answered.  We are ready to proceed as outlined. The likelihood of the patient achieving the goals of this procedure is very likely.   BP 119/79  Pulse 81  Temp(Src) 98.1 F (36.7 C) (Oral)  Resp 20  Ht 5\' 3"  (1.6 m)  Wt 150 lb (68.04 kg)  BMI 26.58 kg/m2  SpO2 100%  CBC    Component Value Date/Time   WBC 8.8 12/18/2012 0940   RBC 4.26 12/18/2012 0940   HGB 14.3 12/18/2012 0940   HCT 41.6 12/18/2012 0940   PLT 244 12/18/2012 0940   MCV 97.7 12/18/2012 0940   MCH 33.6 12/18/2012 0940   MCHC 34.4 12/18/2012 0940   RDW 12.7 12/18/2012 0940    Leonard Schwartz, M.D.

## 2012-12-18 NOTE — Transfer of Care (Signed)
Immediate Anesthesia Transfer of Care Note  Patient: Misty Valdez  Procedure(s) Performed: Procedure(s) with comments: DILATATION & CURETTAGE/HYSTEROSCOPY WITH RESECTOCOPE WITH ROLLERBALL (N/A) - Hysteroscopy with resection of the endometrium using Rollerball  Patient Location: PACU  Anesthesia Type:General  Level of Consciousness: awake, oriented and patient cooperative  Airway & Oxygen Therapy: Patient Spontanous Breathing and Patient connected to nasal cannula oxygen  Post-op Assessment: Report given to PACU RN and Post -op Vital signs reviewed and stable  Post vital signs: Reviewed and stable  Complications: No apparent anesthesia complications

## 2012-12-19 ENCOUNTER — Encounter (HOSPITAL_COMMUNITY): Payer: Self-pay | Admitting: Obstetrics and Gynecology

## 2013-04-23 ENCOUNTER — Other Ambulatory Visit: Payer: Self-pay

## 2013-04-23 MED ORDER — ALPRAZOLAM 1 MG PO TABS: 1.0000 mg | ORAL_TABLET | Freq: Three times a day (TID) | ORAL | Status: DC

## 2013-04-23 NOTE — Telephone Encounter (Signed)
Received a fax from [facility] requesting Alprazolam 1 mg 90 day supply

## 2013-04-23 NOTE — Telephone Encounter (Signed)
Script faxed.

## 2013-04-24 ENCOUNTER — Other Ambulatory Visit: Payer: Self-pay | Admitting: Internal Medicine

## 2013-04-25 NOTE — Telephone Encounter (Signed)
Script has been faxed to [facility] [phone]

## 2013-06-05 ENCOUNTER — Other Ambulatory Visit: Payer: Self-pay | Admitting: Internal Medicine

## 2013-06-06 ENCOUNTER — Other Ambulatory Visit: Payer: Self-pay | Admitting: Internal Medicine

## 2013-06-19 ENCOUNTER — Other Ambulatory Visit: Payer: Self-pay | Admitting: Internal Medicine

## 2013-07-22 ENCOUNTER — Other Ambulatory Visit: Payer: Self-pay

## 2013-07-23 MED ORDER — ALPRAZOLAM 1 MG PO TABS: 1.0000 mg | ORAL_TABLET | Freq: Three times a day (TID) | ORAL | Status: AC | PRN

## 2013-07-23 MED ORDER — ALPRAZOLAM 1 MG PO TABS: 1.0000 mg | ORAL_TABLET | Freq: Three times a day (TID) | ORAL | Status: DC | PRN

## 2013-07-23 NOTE — Addendum Note (Signed)
Addended by: Lyanne CoANDREWS, Nakoa Ganus R on: 07/23/2013 08:29 AM   Modules accepted: Orders

## 2013-07-23 NOTE — Telephone Encounter (Addendum)
[facility] [phone]

## 2014-04-28 ENCOUNTER — Encounter (HOSPITAL_COMMUNITY): Payer: Self-pay | Admitting: Obstetrics and Gynecology

## 2014-10-31 ENCOUNTER — Other Ambulatory Visit: Payer: Self-pay

## 2014-10-31 DIAGNOSIS — Z1231 Encounter for screening mammogram for malignant neoplasm of breast: Secondary | ICD-10-CM

## 2014-11-03 ENCOUNTER — Ambulatory Visit
Admission: RE | Admit: 2014-11-03 | Discharge: 2014-11-03 | Disposition: A | Discharge status: Home / Self Care | Payer: BLUE CROSS/BLUE SHIELD | Source: Ambulatory Visit

## 2014-11-03 DIAGNOSIS — Z1231 Encounter for screening mammogram for malignant neoplasm of breast: Secondary | ICD-10-CM

## 2014-12-11 ENCOUNTER — Telehealth: Payer: Self-pay | Admitting: Internal Medicine

## 2014-12-11 NOTE — Telephone Encounter (Signed)
Left patient vm to call back to transfer from [facility] to another PCP °

## 2016-01-15 ENCOUNTER — Other Ambulatory Visit: Payer: Self-pay | Admitting: Obstetrics and Gynecology

## 2016-01-15 DIAGNOSIS — Z1231 Encounter for screening mammogram for malignant neoplasm of breast: Secondary | ICD-10-CM

## 2016-01-27 ENCOUNTER — Ambulatory Visit
Admission: RE | Admit: 2016-01-27 | Discharge: 2016-01-27 | Disposition: A | Discharge status: Home / Self Care | Payer: BLUE CROSS/BLUE SHIELD | Source: Ambulatory Visit | Attending: Obstetrics and Gynecology | Admitting: Obstetrics and Gynecology

## 2016-01-27 DIAGNOSIS — Z1231 Encounter for screening mammogram for malignant neoplasm of breast: Secondary | ICD-10-CM

## 2018-02-19 ENCOUNTER — Other Ambulatory Visit: Payer: Self-pay | Admitting: *Deleted

## 2018-02-19 DIAGNOSIS — Z1231 Encounter for screening mammogram for malignant neoplasm of breast: Secondary | ICD-10-CM

## 2018-03-14 ENCOUNTER — Ambulatory Visit: Payer: BLUE CROSS/BLUE SHIELD

## 2018-03-16 ENCOUNTER — Ambulatory Visit
Admission: RE | Admit: 2018-03-16 | Discharge: 2018-03-16 | Disposition: A | Discharge status: Home / Self Care | Payer: BLUE CROSS/BLUE SHIELD | Source: Ambulatory Visit | Attending: *Deleted | Admitting: *Deleted

## 2018-03-16 DIAGNOSIS — Z1231 Encounter for screening mammogram for malignant neoplasm of breast: Secondary | ICD-10-CM

## 2019-05-09 IMAGING — MG DIGITAL SCREENING BILATERAL MAMMOGRAM WITH TOMO AND CAD
8 series · 8 of 24 positions shown · non-contrast
Comparison: Previous exam(s).

CLINICAL DATA: Screening.

EXAM:
DIGITAL SCREENING BILATERAL MAMMOGRAM WITH TOMO AND CAD

[R CC synth-2D]
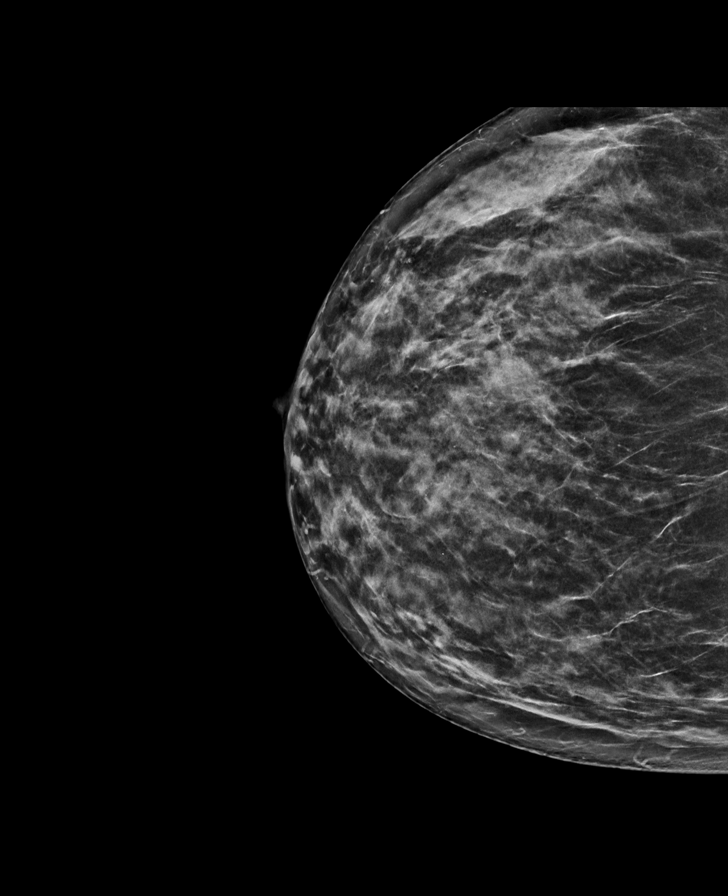

[L CC synth-2D]
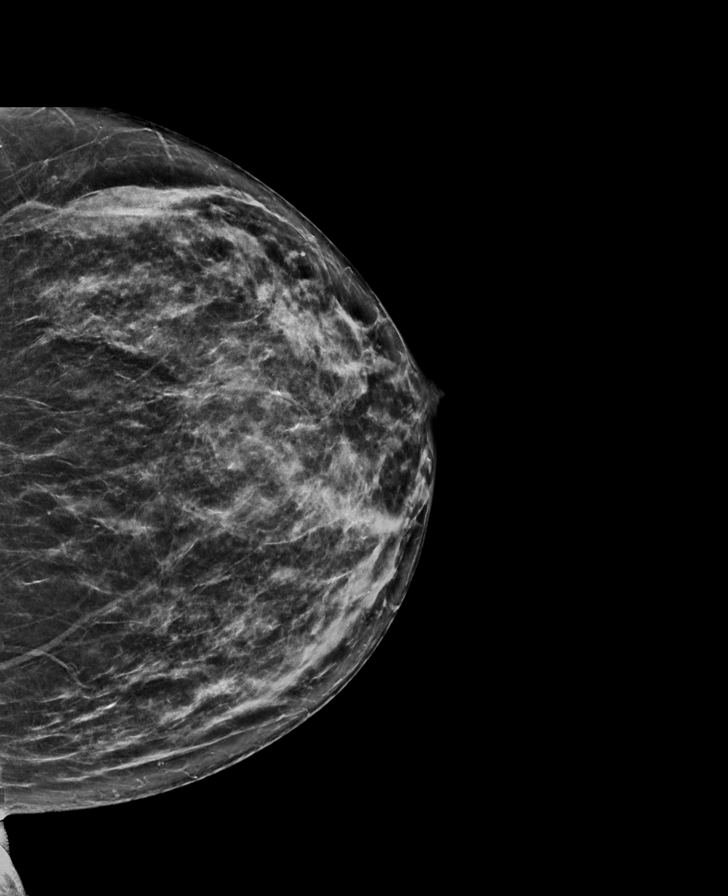

[R MLO synth-2D]
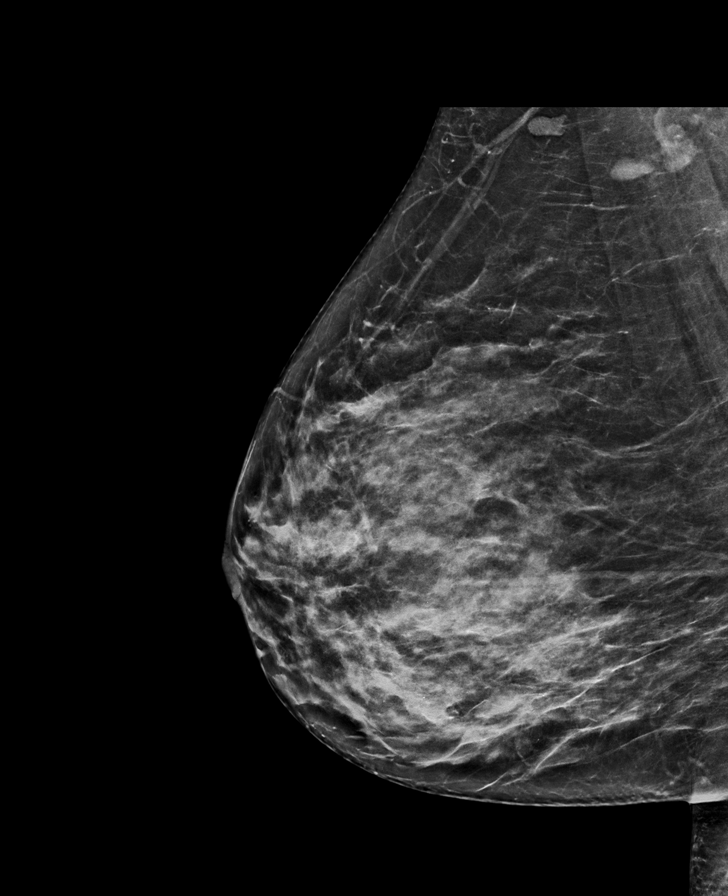

[L MLO synth-2D]
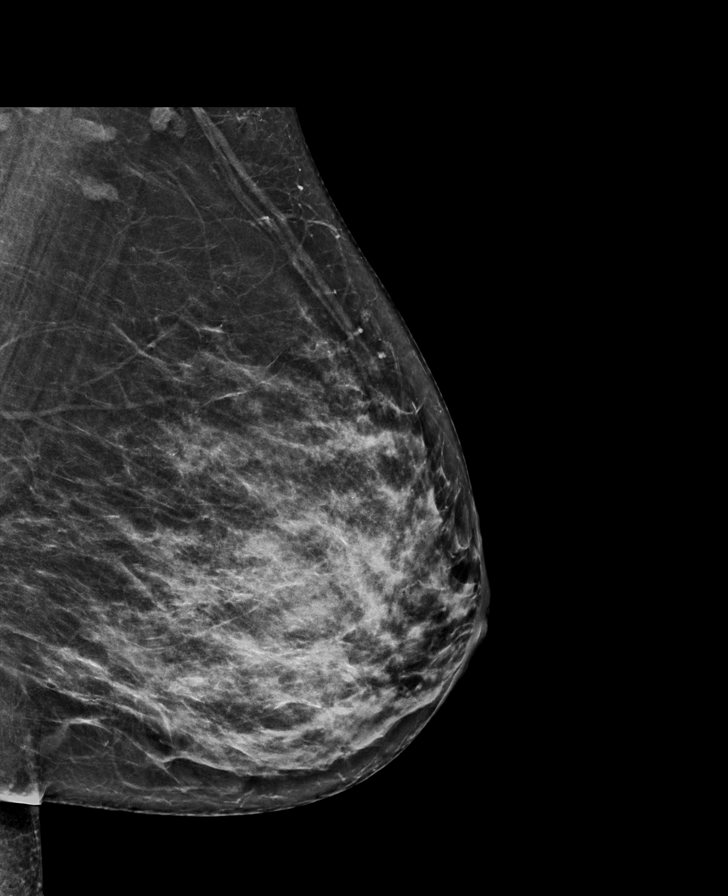

[R CC tomo · tomo slice 33/65.0]
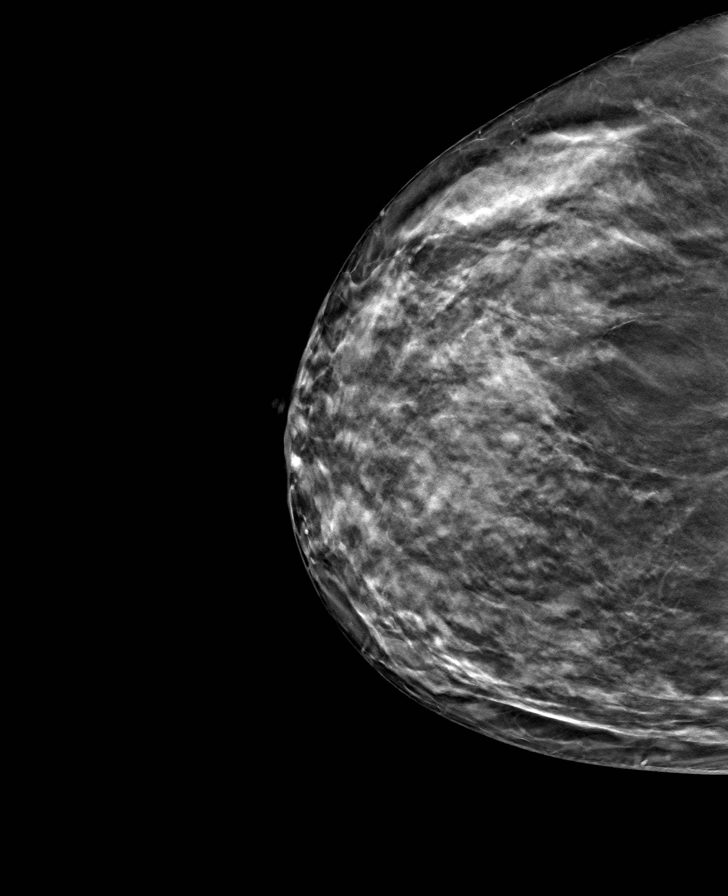

[L MLO tomo · tomo slice 37/74.0]
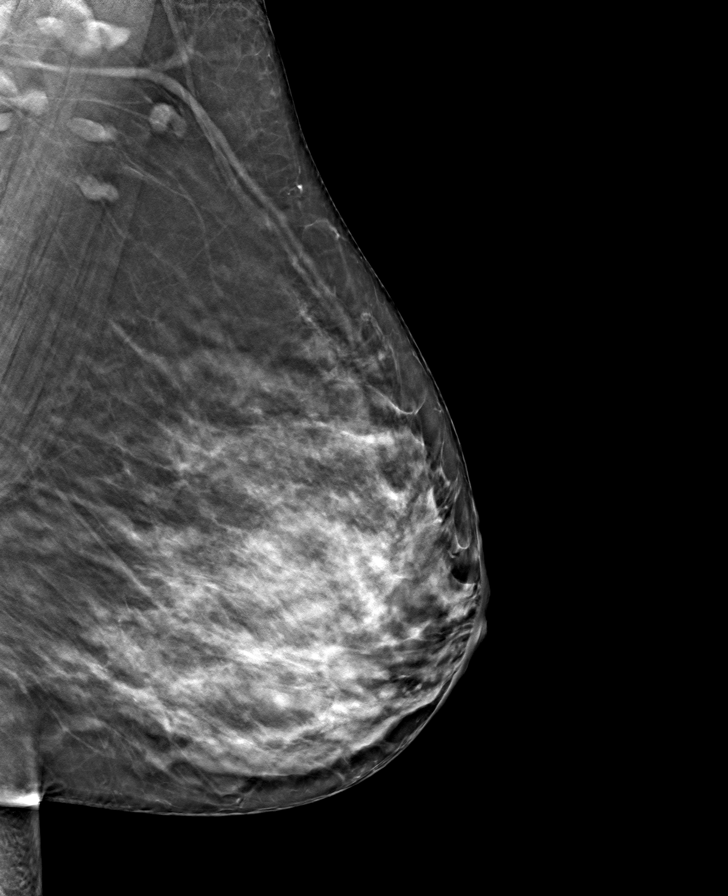

[R MLO tomo · tomo slice 37/73.0]
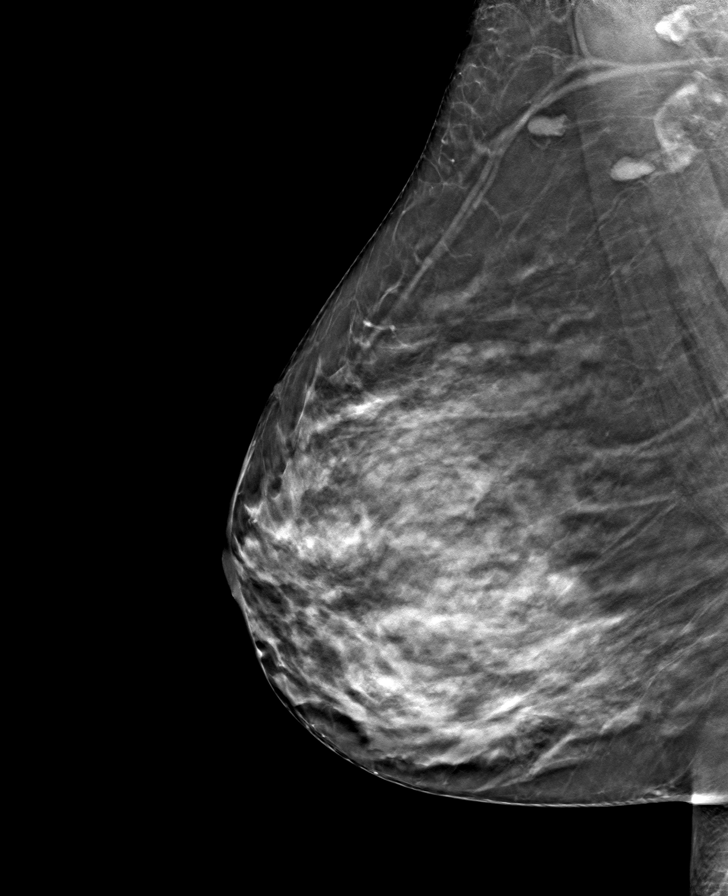

[L CC tomo · tomo slice 35/68.0]
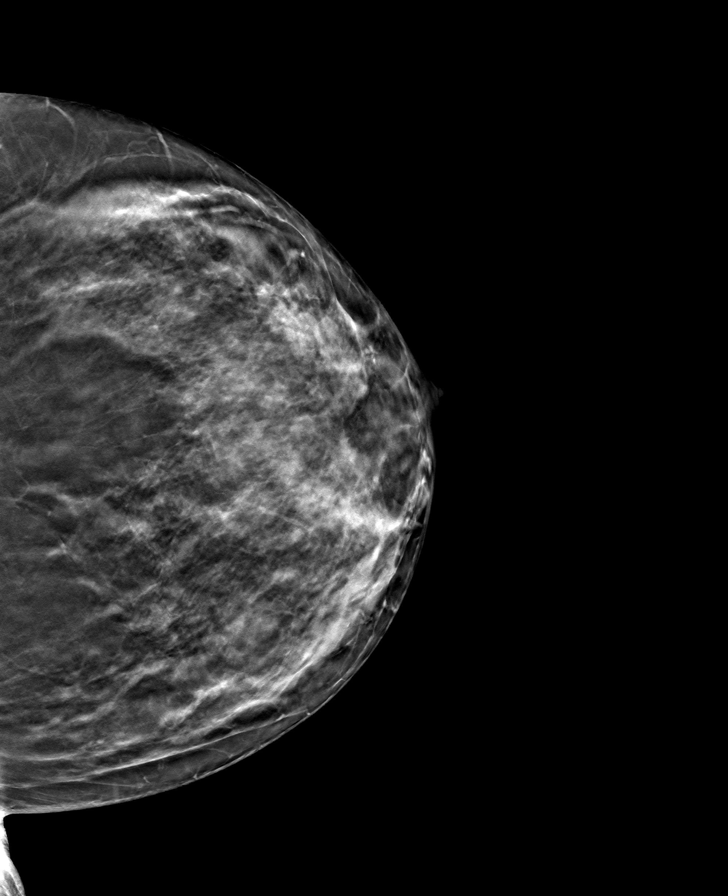

[8 of 24 positions shown; findings below may reference images not displayed]

ACR Breast Density Category c: The breast tissue is heterogeneously
dense, which may obscure small masses.
FINDINGS: There are no findings suspicious for malignancy. Images were
processed with CAD.
IMPRESSION: No mammographic evidence of malignancy. A result letter of this
screening mammogram will be mailed directly to the patient.

RECOMMENDATION:
Screening mammogram in one year. (Code:[id number])

BI-RADS CATEGORY  1: Negative.

## 2019-06-03 ENCOUNTER — Other Ambulatory Visit: Payer: Self-pay | Admitting: Obstetrics and Gynecology

## 2019-06-19 ENCOUNTER — Other Ambulatory Visit: Payer: Self-pay | Admitting: Obstetrics and Gynecology
